# Patient Record
Sex: Male | Born: 1935 | Race: White | Hispanic: No | Smoking: Former smoker
Health system: Southern US, Community
[De-identification: ages and names within clinical notes are randomized; demographics above are authoritative.]

## PROBLEM LIST (undated history)

## (undated) DIAGNOSIS — E079 Disorder of thyroid, unspecified: Secondary | ICD-10-CM

## (undated) DIAGNOSIS — E119 Type 2 diabetes mellitus without complications: Secondary | ICD-10-CM

## (undated) DIAGNOSIS — C801 Malignant (primary) neoplasm, unspecified: Secondary | ICD-10-CM

## (undated) DIAGNOSIS — H9190 Unspecified hearing loss, unspecified ear: Secondary | ICD-10-CM

## (undated) DIAGNOSIS — F329 Major depressive disorder, single episode, unspecified: Secondary | ICD-10-CM

## (undated) DIAGNOSIS — F32A Depression, unspecified: Secondary | ICD-10-CM

## (undated) DIAGNOSIS — M810 Age-related osteoporosis without current pathological fracture: Secondary | ICD-10-CM

## (undated) DIAGNOSIS — I10 Essential (primary) hypertension: Secondary | ICD-10-CM

## (undated) DIAGNOSIS — N289 Disorder of kidney and ureter, unspecified: Secondary | ICD-10-CM

## (undated) DIAGNOSIS — E7800 Pure hypercholesterolemia, unspecified: Secondary | ICD-10-CM

## (undated) HISTORY — PX: PROSTATECTOMY: SHX69

## (undated) HISTORY — PX: BACK SURGERY: SHX140

## (undated) HISTORY — PX: NEPHRECTOMY: SHX65

## (undated) HISTORY — PX: JOINT REPLACEMENT: SHX530

---

## 2016-05-05 ENCOUNTER — Emergency Department (HOSPITAL_BASED_OUTPATIENT_CLINIC_OR_DEPARTMENT_OTHER): Payer: Medicare HMO

## 2016-05-05 ENCOUNTER — Observation Stay (HOSPITAL_BASED_OUTPATIENT_CLINIC_OR_DEPARTMENT_OTHER): Payer: Medicare HMO

## 2016-05-05 ENCOUNTER — Observation Stay (HOSPITAL_COMMUNITY): Payer: Medicare HMO

## 2016-05-05 ENCOUNTER — Encounter (HOSPITAL_BASED_OUTPATIENT_CLINIC_OR_DEPARTMENT_OTHER): Payer: Self-pay

## 2016-05-05 ENCOUNTER — Inpatient Hospital Stay (HOSPITAL_BASED_OUTPATIENT_CLINIC_OR_DEPARTMENT_OTHER)
Admission: EM | Admit: 2016-05-05 | Discharge: 2016-05-09 | DRG: 689 | Disposition: A | Discharge status: Skilled Nursing Facility | Payer: Medicare HMO | Attending: Internal Medicine | Admitting: Internal Medicine

## 2016-05-05 DIAGNOSIS — Z7982 Long term (current) use of aspirin: Secondary | ICD-10-CM

## 2016-05-05 DIAGNOSIS — G936 Cerebral edema: Secondary | ICD-10-CM | POA: Diagnosis present

## 2016-05-05 DIAGNOSIS — N183 Chronic kidney disease, stage 3 (moderate): Secondary | ICD-10-CM | POA: Diagnosis present

## 2016-05-05 DIAGNOSIS — H9190 Unspecified hearing loss, unspecified ear: Secondary | ICD-10-CM | POA: Diagnosis present

## 2016-05-05 DIAGNOSIS — M810 Age-related osteoporosis without current pathological fracture: Secondary | ICD-10-CM | POA: Diagnosis present

## 2016-05-05 DIAGNOSIS — I071 Rheumatic tricuspid insufficiency: Secondary | ICD-10-CM | POA: Diagnosis present

## 2016-05-05 DIAGNOSIS — IMO0002 Reserved for concepts with insufficient information to code with codable children: Secondary | ICD-10-CM | POA: Diagnosis present

## 2016-05-05 DIAGNOSIS — N390 Urinary tract infection, site not specified: Secondary | ICD-10-CM | POA: Diagnosis present

## 2016-05-05 DIAGNOSIS — E119 Type 2 diabetes mellitus without complications: Secondary | ICD-10-CM

## 2016-05-05 DIAGNOSIS — R7989 Other specified abnormal findings of blood chemistry: Secondary | ICD-10-CM | POA: Diagnosis present

## 2016-05-05 DIAGNOSIS — E1122 Type 2 diabetes mellitus with diabetic chronic kidney disease: Secondary | ICD-10-CM | POA: Diagnosis present

## 2016-05-05 DIAGNOSIS — Z87442 Personal history of urinary calculi: Secondary | ICD-10-CM

## 2016-05-05 DIAGNOSIS — R627 Adult failure to thrive: Secondary | ICD-10-CM | POA: Diagnosis present

## 2016-05-05 DIAGNOSIS — Z9119 Patient's noncompliance with other medical treatment and regimen: Secondary | ICD-10-CM

## 2016-05-05 DIAGNOSIS — E7800 Pure hypercholesterolemia, unspecified: Secondary | ICD-10-CM | POA: Diagnosis present

## 2016-05-05 DIAGNOSIS — R410 Disorientation, unspecified: Secondary | ICD-10-CM

## 2016-05-05 DIAGNOSIS — I129 Hypertensive chronic kidney disease with stage 1 through stage 4 chronic kidney disease, or unspecified chronic kidney disease: Secondary | ICD-10-CM | POA: Diagnosis present

## 2016-05-05 DIAGNOSIS — I4891 Unspecified atrial fibrillation: Secondary | ICD-10-CM | POA: Diagnosis present

## 2016-05-05 DIAGNOSIS — F329 Major depressive disorder, single episode, unspecified: Secondary | ICD-10-CM | POA: Diagnosis present

## 2016-05-05 DIAGNOSIS — C801 Malignant (primary) neoplasm, unspecified: Secondary | ICD-10-CM | POA: Diagnosis present

## 2016-05-05 DIAGNOSIS — R509 Fever, unspecified: Secondary | ICD-10-CM | POA: Diagnosis present

## 2016-05-05 DIAGNOSIS — Z7984 Long term (current) use of oral hypoglycemic drugs: Secondary | ICD-10-CM

## 2016-05-05 DIAGNOSIS — E079 Disorder of thyroid, unspecified: Secondary | ICD-10-CM | POA: Diagnosis present

## 2016-05-05 DIAGNOSIS — Z87891 Personal history of nicotine dependence: Secondary | ICD-10-CM

## 2016-05-05 DIAGNOSIS — G9340 Encephalopathy, unspecified: Secondary | ICD-10-CM | POA: Diagnosis present

## 2016-05-05 DIAGNOSIS — D329 Benign neoplasm of meninges, unspecified: Secondary | ICD-10-CM | POA: Diagnosis present

## 2016-05-05 DIAGNOSIS — R531 Weakness: Secondary | ICD-10-CM | POA: Diagnosis not present

## 2016-05-05 HISTORY — DX: Unspecified hearing loss, unspecified ear: H91.90

## 2016-05-05 HISTORY — DX: Depression, unspecified: F32.A

## 2016-05-05 HISTORY — DX: Pure hypercholesterolemia, unspecified: E78.00

## 2016-05-05 HISTORY — DX: Disorder of thyroid, unspecified: E07.9

## 2016-05-05 HISTORY — DX: Disorder of kidney and ureter, unspecified: N28.9

## 2016-05-05 HISTORY — DX: Major depressive disorder, single episode, unspecified: F32.9

## 2016-05-05 HISTORY — DX: Malignant (primary) neoplasm, unspecified: C80.1

## 2016-05-05 HISTORY — DX: Age-related osteoporosis without current pathological fracture: M81.0

## 2016-05-05 HISTORY — DX: Type 2 diabetes mellitus without complications: E11.9

## 2016-05-05 HISTORY — DX: Essential (primary) hypertension: I10

## 2016-05-05 LAB — TROPONIN I: Troponin I: 0.03 ng/mL (ref <0.03)

## 2016-05-05 LAB — CBC WITH DIFFERENTIAL/PLATELET
BASOS PCT: 0 %
Basophils Absolute: 0 10*3/uL (ref 0.0–0.1)
EOS ABS: 0.1 10*3/uL (ref 0.0–0.7)
Eosinophils Relative: 0 %
HCT: 45.1 % (ref 39.0–52.0)
HEMOGLOBIN: 14.8 g/dL (ref 13.0–17.0)
Lymphocytes Relative: 5 %
Lymphs Abs: 0.6 10*3/uL — ABNORMAL LOW (ref 0.7–4.0)
MCH: 29.7 pg (ref 26.0–34.0)
MCHC: 32.8 g/dL (ref 30.0–36.0)
MCV: 90.4 fL (ref 78.0–100.0)
MONOS PCT: 7 %
Monocytes Absolute: 0.8 10*3/uL (ref 0.1–1.0)
NEUTROS PCT: 88 %
Neutro Abs: 10.1 10*3/uL — ABNORMAL HIGH (ref 1.7–7.7)
Platelets: 204 10*3/uL (ref 150–400)
RBC: 4.99 MIL/uL (ref 4.22–5.81)
RDW: 14.5 % (ref 11.5–15.5)
WBC: 11.5 10*3/uL — AB (ref 4.0–10.5)

## 2016-05-05 LAB — URINALYSIS, ROUTINE W REFLEX MICROSCOPIC
BILIRUBIN URINE: NEGATIVE
Glucose, UA: NEGATIVE mg/dL
KETONES UR: NEGATIVE mg/dL
NITRITE: NEGATIVE
PROTEIN: 100 mg/dL — AB
Specific Gravity, Urine: 1.018 (ref 1.005–1.030)
pH: 5.5 (ref 5.0–8.0)

## 2016-05-05 LAB — COMPREHENSIVE METABOLIC PANEL
ALBUMIN: 4.1 g/dL (ref 3.5–5.0)
ALT: 20 U/L (ref 17–63)
ANION GAP: 11 (ref 5–15)
AST: 29 U/L (ref 15–41)
Alkaline Phosphatase: 59 U/L (ref 38–126)
BUN: 26 mg/dL — ABNORMAL HIGH (ref 6–20)
CALCIUM: 9.1 mg/dL (ref 8.9–10.3)
CO2: 23 mmol/L (ref 22–32)
Chloride: 101 mmol/L (ref 101–111)
Creatinine, Ser: 1.78 mg/dL — ABNORMAL HIGH (ref 0.61–1.24)
GFR calc Af Amer: 39 mL/min — ABNORMAL LOW (ref >60)
GFR calc non Af Amer: 34 mL/min — ABNORMAL LOW (ref >60)
GLUCOSE: 164 mg/dL — AB (ref 65–99)
Potassium: 4.4 mmol/L (ref 3.5–5.1)
SODIUM: 135 mmol/L (ref 135–145)
Total Bilirubin: 0.9 mg/dL (ref 0.3–1.2)
Total Protein: 7.7 g/dL (ref 6.5–8.1)

## 2016-05-05 LAB — URINALYSIS, MICROSCOPIC (REFLEX)

## 2016-05-05 LAB — CK: Total CK: 299 U/L (ref 49–397)

## 2016-05-05 LAB — ETHANOL: Alcohol, Ethyl (B): <5 mg/dL (ref <5)

## 2016-05-05 LAB — LIPASE, BLOOD: LIPASE: 13 U/L (ref 11–51)

## 2016-05-05 LAB — I-STAT CG4 LACTIC ACID, ED: LACTIC ACID, VENOUS: 1.5 mmol/L (ref 0.5–1.9)

## 2016-05-05 LAB — PROTIME-INR
INR: 1.05
Prothrombin Time: 13.7 seconds (ref 11.4–15.2)

## 2016-05-05 LAB — AMMONIA: Ammonia: 38 umol/L — ABNORMAL HIGH (ref 9–35)

## 2016-05-05 MED ORDER — SODIUM CHLORIDE 0.9 % IV SOLN: 250.0000 mL | INTRAVENOUS | Status: DC | PRN

## 2016-05-05 MED ORDER — SODIUM CHLORIDE 0.9% FLUSH: 3.0000 mL | INTRAVENOUS | Status: DC | PRN

## 2016-05-05 MED ORDER — ASPIRIN 81 MG PO CHEW
324.0000 mg | CHEWABLE_TABLET | Freq: Once | ORAL | Status: AC
  Administered 2016-05-05: 324 mg via ORAL
  Filled 2016-05-05: qty 4

## 2016-05-05 MED ORDER — SODIUM CHLORIDE 0.9% FLUSH
3.0000 mL | Freq: Two times a day (BID) | INTRAVENOUS | Status: DC
  Administered 2016-05-06 – 2016-05-09 (×6): 3 mL via INTRAVENOUS

## 2016-05-05 MED ORDER — METOPROLOL TARTRATE 25 MG PO TABS
25.0000 mg | ORAL_TABLET | Freq: Once | ORAL | Status: AC
  Administered 2016-05-06: 25 mg via ORAL
  Filled 2016-05-05: qty 1

## 2016-05-05 MED ORDER — DEXTROSE 5 % IV SOLN
1.0000 g | INTRAVENOUS | Status: DC
  Administered 2016-05-06 – 2016-05-08 (×4): 1 g via INTRAVENOUS
  Filled 2016-05-05 (×4): qty 10

## 2016-05-05 NOTE — ED Provider Notes (Signed)
[facility] DEPT [facility] Provider Note   CSN: [id number] Arrival date & time: 05/05/16  1833    By signing my name below, I, Macon Large, attest that this documentation has been prepared under the direction and in the presence of Millicent Blazejewski Julio Alm, MD. Electronically Signed: Macon Large, ED Scribe. 05/05/16. 7:42 PM.  History   Chief Complaint Chief Complaint  Patient presents with  . Weakness   The history is provided by the patient and a relative. No language interpreter was used.   HPI Comments: Tanner Manning is a 83 y.o. male with PMHx of renal disorder, thyroid disease, HTN, DM, osteoporosis, high cholesterol and depression brought in by son who presents to the Emergency Department presenting for further evaluation of mental status decline onset two days ago. Per pt's son, he states pt was unaware that his birthday was today. He also notes pt has been having trouble recalling certain things. Son reports associated involuntary incontinence followed by a fowl urine odor and episodic diarrhea onset a couple of days ago. He states pt has had "unhealthy lifestyle" while he has been living at home with "no ventilation, no heat, no running water, no bathroom, and hasn't bathed in over a year". He also reports associated generalized weakness. He notes pt is unable to ambulate on his own. Son states "he takes about two hours just to move one foot in front of the other." No modifying factors noted. Pt denies abdominal pain.   Past Medical History:  Diagnosis Date  . Cancer [facility])    prostate and renal  . Depression   . Diabetes mellitus without complication ([place])   . Hard of hearing   . High cholesterol   . Hypertension   . Osteoporosis   . Renal disorder    kidney stone, stage 3 kidney disease  . Thyroid disease     There are no active problems to display for this patient.   Past Surgical History:  Procedure Laterality Date  . BACK SURGERY    . JOINT REPLACEMENT      . NEPHRECTOMY    . PROSTATECTOMY         Home Medications    Prior to Admission medications   Medication Sig Start Date End Date Taking? Authorizing Provider  aspirin EC 81 MG tablet Take 81 mg by mouth daily.   Yes Historical Provider, MD  citalopram (CELEXA) 40 MG tablet Take 40 mg by mouth daily.   Yes Historical Provider, MD  glimepiride (AMARYL) 4 MG tablet Take 4 mg by mouth daily with breakfast.   Yes Historical Provider, MD  labetalol (NORMODYNE) 200 MG tablet Take 200 mg by mouth 2 (two) times daily.   Yes Historical Provider, MD  levothyroxine (SYNTHROID, LEVOTHROID) 150 MCG tablet Take 150 mcg by mouth daily before breakfast.   Yes Historical Provider, MD  simvastatin (ZOCOR) 40 MG tablet Take 40 mg by mouth daily at 6 PM.   Yes Historical Provider, MD  Triptorelin Pamoate (TRELSTAR) 22.5 MG injection Inject 22.5 mg into the muscle every 6 (six) months.   Yes Historical Provider, MD    Family History No family history on file.  Social History Social History  Substance Use Topics  . Smoking status: Former Research scientist (life sciences)  . Smokeless tobacco: Never Used  . Alcohol use No     Allergies   Patient has no known allergies.   Review of Systems Review of Systems  Gastrointestinal: Positive for diarrhea. Negative for abdominal pain.  Genitourinary:       +  involuntary incontinence   Neurological: Positive for weakness.  Psychiatric/Behavioral: Positive for confusion.       +memory loss  All other systems reviewed and are negative.    Physical Exam Updated Vital Signs BP (!) 160/111 (BP Location: Right Arm)   Pulse 100   Temp (!) 100.4 F (38 C) (Oral)   Resp (!) 22   Ht 6' (1.829 m)   Wt 220 lb (99.8 kg)   SpO2 92%   BMI 29.84 kg/m   Physical Exam  Constitutional: He appears well-developed and well-nourished.  Toenail fungus, unkept toe nails.  Erythema in skin folds consistent with fungal infection   HENT:  Head: Normocephalic and atraumatic.  Scattered  carried teeth   Eyes: Conjunctivae are normal. Right eye exhibits no discharge. Left eye exhibits no discharge.  Sty in left eye.   Pulmonary/Chest: Effort normal.  Scattered rhonchi on right left lung.   Neurological: He is alert. Coordination normal.  Skin: Skin is warm and dry. No rash noted. He is not diaphoretic. No erythema.  Psychiatric: He has a normal mood and affect.  Nursing note and vitals reviewed.    ED Treatments / Results   DIAGNOSTIC STUDIES: Oxygen Saturation is 94% on RA, normal by my interpretation.    COORDINATION OF CARE: 7:20 PM Discussed treatment plan with pt's son and pt at bedside which includes labs, chest imaging, EKG and pt's son agreed to plan.   Labs (all labs ordered are listed, but only abnormal results are displayed) Labs Reviewed  URINALYSIS, ROUTINE W REFLEX MICROSCOPIC - Abnormal; Notable for the following:       Result Value   APPearance CLOUDY (*)    Hgb urine dipstick MODERATE (*)    Protein, ur 100 (*)    Leukocytes, UA TRACE (*)    All other components within normal limits  CBC WITH DIFFERENTIAL/PLATELET - Abnormal; Notable for the following:    WBC 11.5 (*)    Neutro Abs 10.1 (*)    Lymphs Abs 0.6 (*)    All other components within normal limits  COMPREHENSIVE METABOLIC PANEL - Abnormal; Notable for the following:    Glucose, Bld 164 (*)    BUN 26 (*)    Creatinine, Ser 1.78 (*)    GFR calc non Af Amer 34 (*)    GFR calc Af Amer 39 (*)    All other components within normal limits  TROPONIN I - Abnormal; Notable for the following:    Troponin I 0.03 (*)    All other components within normal limits  URINALYSIS, MICROSCOPIC (REFLEX) - Abnormal; Notable for the following:    Bacteria, UA RARE (*)    Squamous Epithelial / LPF 0-5 (*)    All other components within normal limits  AMMONIA - Abnormal; Notable for the following:    Ammonia 38 (*)    All other components within normal limits  CULTURE, BLOOD (ROUTINE X 2)    CULTURE, BLOOD (ROUTINE X 2)  LIPASE, BLOOD  PROTIME-INR  ETHANOL  CK  I-STAT CG4 LACTIC ACID, ED    EKG  EKG Interpretation None       Radiology Dg Chest 2 View  Result Date: 05/05/2016 CLINICAL DATA:  Failure to thrive. Incontinence. Memory loss. Chest pain. EXAM: CHEST  2 VIEW COMPARISON:  None. FINDINGS: Both lateral views are degraded by patient positioning, including the arms not being raised above the head. Moderate thoracic spondylosis. Mild to moderate right hemidiaphragm elevation. Apical lordotic frontal radiograph.  Midline trachea. Patient rotated minimally right on the frontal. Cardiomegaly. Mildly tortuous thoracic aorta. No pleural effusion or pneumothorax. No congestive failure. IMPRESSION: No acute cardiopulmonary disease. Cardiomegaly without congestive failure. Electronically Signed   By: Abigail Miyamoto M.D.   On: 05/05/2016 20:15    Procedures Procedures (including critical care time)  Medications Ordered in ED Medications - No data to display   Initial Impression / Assessment and Plan / ED Course  I have reviewed the triage vital signs and the nursing notes.  Pertinent labs & imaging results that were available during my care of the patient were reviewed by me and considered in my medical decision making (see chart for details).     I personally performed the services described in this documentation, which was scribed in my presence. The recorded information has been reviewed and is accurate.    Patient is an 83 year old male whose been living as a recluse. According to son patient's not had any heat, running water, and has been refusing care. Patient arrives here with fever, hypertension, tachycardia, and appearance of being unkempt. Patient appears to have mold growing on parts of his body and does not appear to have been bathed recently. Patient has elevated troponin, fever, blood cultures are pending. No current source of fever. Will admit for serial  troponins, blood cultures results.  Patient is unable to give me good history. It is unclear whether this A. fib is new. Patient's son is at bedside and has been unable to get patient to go to his physicians lately.  May require placement in rehab.   Final Clinical Impressions(s) / ED Diagnoses   Final diagnoses:  None    New Prescriptions New Prescriptions   No medications on file     Karman Biswell Julio Alm, MD 05/05/16 2051

## 2016-05-05 NOTE — ED Notes (Signed)
Pt will be going to [facility] room 2W10. Report to be called to [phone]

## 2016-05-05 NOTE — ED Notes (Signed)
Pts family member son took all his clothes to his car in a pt belongings bag.

## 2016-05-05 NOTE — ED Notes (Signed)
Attempted to give report to 2000. Nurse will call me back.

## 2016-05-05 NOTE — ED Notes (Signed)
Pt on monitor 

## 2016-05-05 NOTE — H&P (Signed)
History and Physical    Tanner Manning FOY:[id number] DOB: 1933-04-08 DOA: 05/05/2016  PCP: Manfred Shirts, PA  Patient coming from: home  Chief Complaint:   confusion  HPI: Tanner Manning is a 83 y.o. male with medical history significant of noncompliance has not seen a doctor for years was found at home by his family confused.  Unknown what pts baseline is.  Was found to be running a fever.  He says he has been weak for several days.  He is hard of hearing and does seem to be a little confused but for the most part he knows he is in the hospital and it is 2018.  He denies any n/v/d.  No dysuria.  No cough.  No rashes.  Pt sent over from [facility] for fever.     Review of Systems: As per HPI otherwise 10 point review of systems negative.   Past Medical History:  Diagnosis Date  . Cancer [facility])    prostate and renal  . Depression   . Diabetes mellitus without complication ([place])   . Hard of hearing   . High cholesterol   . Hypertension   . Osteoporosis   . Renal disorder    kidney stone, stage 3 kidney disease  . Thyroid disease     Past Surgical History:  Procedure Laterality Date  . BACK SURGERY    . JOINT REPLACEMENT    . NEPHRECTOMY    . PROSTATECTOMY       reports that he has quit smoking. He has never used smokeless tobacco. He reports that he does not drink alcohol. His drug history is not on file.  No Known Allergies  No family history on file. no h/o CAD  Prior to Admission medications   Medication Sig Start Date End Date Taking? Authorizing Provider  aspirin EC 81 MG tablet Take 81 mg by mouth daily.   Yes Historical Provider, MD  citalopram (CELEXA) 40 MG tablet Take 40 mg by mouth daily.   Yes Historical Provider, MD  glimepiride (AMARYL) 4 MG tablet Take 4 mg by mouth daily with breakfast.   Yes Historical Provider, MD  labetalol (NORMODYNE) 200 MG tablet Take 200 mg by mouth 2 (two) times daily.   Yes Historical Provider, MD  levothyroxine (SYNTHROID, LEVOTHROID)  150 MCG tablet Take 150 mcg by mouth daily before breakfast.   Yes Historical Provider, MD  simvastatin (ZOCOR) 40 MG tablet Take 40 mg by mouth daily at 6 PM.   Yes Historical Provider, MD  Triptorelin Pamoate (TRELSTAR) 22.5 MG injection Inject 22.5 mg into the muscle every 6 (six) months.   Yes Historical Provider, MD    Physical Exam: Vitals:   05/05/16 2119 05/05/16 2119 05/05/16 2146 05/05/16 2254  BP:   (!) 193/115 (!) 170/115  Pulse: (!) 105  96 (!) 109  Resp: (!) 24  (!) 26 (!) 24  Temp:  100.1 F (37.8 C)  (!) 100.5 F (38.1 C)  TempSrc:  Oral  Oral  SpO2: 94%  92% 95%  Weight:    120.4 kg (265 lb 6.9 oz)  Height:        Constitutional: NAD, calm, comfortable Vitals:   05/05/16 2119 05/05/16 2119 05/05/16 2146 05/05/16 2254  BP:   (!) 193/115 (!) 170/115  Pulse: (!) 105  96 (!) 109  Resp: (!) 24  (!) 26 (!) 24  Temp:  100.1 F (37.8 C)  (!) 100.5 F (38.1 C)  TempSrc:  Oral  Oral  SpO2: 94%  92% 95%  Weight:    120.4 kg (265 lb 6.9 oz)  Height:       Eyes: PERRL, lids and conjunctivae normal ENMT: Mucous membranes are moist. Posterior pharynx clear of any exudate or lesions.Normal dentition.  Neck: normal, supple, no masses, no thyromegaly Respiratory: clear to auscultation bilaterally, no wheezing, no crackles. Normal respiratory effort. No accessory muscle use.  Cardiovascular: Regular rate and rhythm, no murmurs / rubs / gallops. No extremity edema. 2+ pedal pulses. No carotid bruits.  Abdomen: no tenderness, no masses palpated. No hepatosplenomegaly. Bowel sounds positive.  Musculoskeletal: no clubbing / cyanosis. No joint deformity upper and lower extremities. Good ROM, no contractures. Normal muscle tone.  Skin: no rashes, lesions, ulcers. No induration Neurologic: CN 2-12 grossly intact. Sensation intact, DTR normal. Strength 5/5 in all 4.  Psychiatric: Normal judgment and insight. Alert and oriented x 3. Normal mood.    Labs on Admission: I have  personally reviewed following labs and imaging studies  CBC:  Recent Labs Lab 05/05/16 1850  WBC 11.5*  NEUTROABS 10.1*  HGB 14.8  HCT 45.1  MCV 90.4  PLT 086   Basic Metabolic Panel:  Recent Labs Lab 05/05/16 1850  NA 135  K 4.4  CL 101  CO2 23  GLUCOSE 164*  BUN 26*  CREATININE 1.78*  CALCIUM 9.1   GFR: Estimated Creatinine Clearance: 43.6 mL/min (A) (by C-G formula based on SCr of 1.78 mg/dL (H)). Liver Function Tests:  Recent Labs Lab 05/05/16 1850  AST 29  ALT 20  ALKPHOS 59  BILITOT 0.9  PROT 7.7  ALBUMIN 4.1    Recent Labs Lab 05/05/16 1850  LIPASE 13    Recent Labs Lab 05/05/16 1957  AMMONIA 38*   Coagulation Profile:  Recent Labs Lab 05/05/16 1850  INR 1.05   Cardiac Enzymes:  Recent Labs Lab 05/05/16 1850  CKTOTAL 299  TROPONINI 0.03*   Urine analysis:    Component Value Date/Time   COLORURINE YELLOW 05/05/2016 1849   APPEARANCEUR CLOUDY (A) 05/05/2016 1849   LABSPEC 1.018 05/05/2016 1849   PHURINE 5.5 05/05/2016 1849   GLUCOSEU NEGATIVE 05/05/2016 1849   HGBUR MODERATE (A) 05/05/2016 1849   BILIRUBINUR NEGATIVE 05/05/2016 [place] 05/05/2016 1849   PROTEINUR 100 (A) 05/05/2016 1849   NITRITE NEGATIVE 05/05/2016 1849   LEUKOCYTESUR TRACE (A) 05/05/2016 1849    Recent Results (from the past 240 hour(s))  Culture, blood (Routine X 2) w Reflex to ID Panel     Status: None (Preliminary result)   Collection Time: 05/05/16  7:45 PM  Result Value Ref Range Status   Specimen Description   Final    BLOOD LEFT AC Performed at [facility], [place] [address] [place], [place] [address]    Special Requests BOTTLES DRAWN AEROBIC AND ANAEROBIC [facility]  Final   Culture PENDING  Incomplete   Report Status PENDING  Incomplete     Radiological Exams on Admission: Dg Chest 2 View  Result Date: 05/05/2016 CLINICAL DATA:  Failure to thrive. Incontinence. Memory loss. Chest pain. EXAM: CHEST  2 VIEW  COMPARISON:  None. FINDINGS: Both lateral views are degraded by patient positioning, including the arms not being raised above the head. Moderate thoracic spondylosis. Mild to moderate right hemidiaphragm elevation. Apical lordotic frontal radiograph. Midline trachea. Patient rotated minimally right on the frontal. Cardiomegaly. Mildly tortuous thoracic aorta. No pleural effusion or pneumothorax. No congestive failure. IMPRESSION: No acute cardiopulmonary disease.  Cardiomegaly without congestive failure. Electronically Signed   By: Abigail Miyamoto M.D.   On: 05/05/2016 20:15    Assessment/Plan 83 yo male with delirium from fever may be due to UTI  Principal Problem:   Acute encephalopathy- delirious.  From fever.  Unknown if has baseline of dementia.  Treat possible uti.  Active Problems:   Diabetes mellitus without complication ([place])- stable   Cancer ([place])- stable   Fever- as above   Acute lower UTI- iv rocephin.  cx pending   A-fib ([place])- unknown if new, start low dose bblocker     DVT prophylaxis:  scds  Code Status:  full Family Communication:  none  Disposition Plan:   Per day team Consults called:  none Admission status:  observation   Keili Hasten A MD [facility]  If 7PM-7AM, please contact night-coverage [email or web address] Password [facility]  05/05/2016, 11:26 PM

## 2016-05-05 NOTE — ED Triage Notes (Addendum)
Pt's son brings him in for failure to thrive, weakness, diarrhea, foul urine odor, incontinence, memory loss, is unsure as to how long this is going on, at least the last two weeks

## 2016-05-06 ENCOUNTER — Observation Stay (HOSPITAL_COMMUNITY): Payer: Medicare HMO

## 2016-05-06 DIAGNOSIS — R7989 Other specified abnormal findings of blood chemistry: Secondary | ICD-10-CM | POA: Diagnosis present

## 2016-05-06 DIAGNOSIS — I4891 Unspecified atrial fibrillation: Secondary | ICD-10-CM | POA: Diagnosis not present

## 2016-05-06 DIAGNOSIS — N390 Urinary tract infection, site not specified: Principal | ICD-10-CM

## 2016-05-06 DIAGNOSIS — R509 Fever, unspecified: Secondary | ICD-10-CM

## 2016-05-06 DIAGNOSIS — IMO0002 Reserved for concepts with insufficient information to code with codable children: Secondary | ICD-10-CM | POA: Diagnosis present

## 2016-05-06 DIAGNOSIS — C801 Malignant (primary) neoplasm, unspecified: Secondary | ICD-10-CM

## 2016-05-06 DIAGNOSIS — G9340 Encephalopathy, unspecified: Secondary | ICD-10-CM | POA: Diagnosis not present

## 2016-05-06 DIAGNOSIS — E119 Type 2 diabetes mellitus without complications: Secondary | ICD-10-CM | POA: Diagnosis not present

## 2016-05-06 LAB — BASIC METABOLIC PANEL
Anion gap: 9 (ref 5–15)
BUN: 23 mg/dL — ABNORMAL HIGH (ref 6–20)
CALCIUM: 9.1 mg/dL (ref 8.9–10.3)
CO2: 26 mmol/L (ref 22–32)
CREATININE: 1.73 mg/dL — AB (ref 0.61–1.24)
Chloride: 100 mmol/L — ABNORMAL LOW (ref 101–111)
GFR calc Af Amer: 41 mL/min — ABNORMAL LOW (ref >60)
GFR calc non Af Amer: 35 mL/min — ABNORMAL LOW (ref >60)
Glucose, Bld: 123 mg/dL — ABNORMAL HIGH (ref 65–99)
Potassium: 4.3 mmol/L (ref 3.5–5.1)
SODIUM: 135 mmol/L (ref 135–145)

## 2016-05-06 LAB — GLUCOSE, CAPILLARY
GLUCOSE-CAPILLARY: 119 mg/dL — AB (ref 65–99)
GLUCOSE-CAPILLARY: 131 mg/dL — AB (ref 65–99)
GLUCOSE-CAPILLARY: 112 mg/dL — AB (ref 65–99)

## 2016-05-06 LAB — RAPID URINE DRUG SCREEN, HOSP PERFORMED
AMPHETAMINES: NOT DETECTED
Barbiturates: NOT DETECTED
Benzodiazepines: NOT DETECTED
Cocaine: NOT DETECTED
Opiates: NOT DETECTED
TETRAHYDROCANNABINOL: NOT DETECTED

## 2016-05-06 LAB — HEMOGLOBIN A1C
Hgb A1c MFr Bld: 5.6 % (ref 4.8–5.6)
Mean Plasma Glucose: 114 mg/dL

## 2016-05-06 LAB — TSH: TSH: 2.889 u[IU]/mL (ref 0.350–4.500)

## 2016-05-06 LAB — CBC
HCT: 43.5 % (ref 39.0–52.0)
Hemoglobin: 13.8 g/dL (ref 13.0–17.0)
MCH: 28.9 pg (ref 26.0–34.0)
MCHC: 31.7 g/dL (ref 30.0–36.0)
MCV: 91 fL (ref 78.0–100.0)
PLATELETS: 173 10*3/uL (ref 150–400)
RBC: 4.78 MIL/uL (ref 4.22–5.81)
RDW: 14.1 % (ref 11.5–15.5)
WBC: 8.9 10*3/uL (ref 4.0–10.5)

## 2016-05-06 LAB — INFLUENZA PANEL BY PCR (TYPE A & B)
INFLAPCR: NEGATIVE
INFLBPCR: NEGATIVE

## 2016-05-06 MED ORDER — ASPIRIN 325 MG PO TABS
325.0000 mg | ORAL_TABLET | Freq: Every day | ORAL | Status: DC
  Administered 2016-05-06 – 2016-05-07 (×2): 325 mg via ORAL
  Filled 2016-05-06 (×2): qty 1

## 2016-05-06 MED ORDER — SODIUM CHLORIDE 0.9 % IV SOLN
INTRAVENOUS | Status: DC
  Administered 2016-05-06 – 2016-05-07 (×2): via INTRAVENOUS

## 2016-05-06 MED ORDER — INSULIN ASPART 100 UNIT/ML ~~LOC~~ SOLN
0.0000 [IU] | Freq: Three times a day (TID) | SUBCUTANEOUS | Status: DC
  Administered 2016-05-06 – 2016-05-09 (×5): 1 [IU] via SUBCUTANEOUS

## 2016-05-06 MED ORDER — GADOBENATE DIMEGLUMINE 529 MG/ML IV SOLN
20.0000 mL | Freq: Once | INTRAVENOUS | Status: AC | PRN
  Administered 2016-05-06: 20 mL via INTRAVENOUS

## 2016-05-06 MED ORDER — LABETALOL HCL 200 MG PO TABS
200.0000 mg | ORAL_TABLET | Freq: Two times a day (BID) | ORAL | Status: DC
  Administered 2016-05-06 – 2016-05-09 (×7): 200 mg via ORAL
  Filled 2016-05-06 (×8): qty 1

## 2016-05-06 MED ORDER — HYDRALAZINE HCL 20 MG/ML IJ SOLN
10.0000 mg | INTRAMUSCULAR | Status: DC | PRN
  Administered 2016-05-06 – 2016-05-07 (×3): 10 mg via INTRAVENOUS
  Filled 2016-05-06 (×3): qty 1

## 2016-05-06 NOTE — Progress Notes (Signed)
PROGRESS NOTE  Tanner Manning  LOV:[id number] DOB: May 22, 1933 DOA: 05/05/2016 PCP: Manfred Shirts, PA Outpatient Specialists:  Subjective: Patient is hard of hearing, denies any complaints this morning. Blood pressure is elevated and his maximum temperature was 100.5.  Brief Narrative:  Tanner Manning is a 83 y.o. male with medical history significant of noncompliance has not seen a doctor for years was found at home by his family confused.  Unknown what pts baseline is.  Was found to be running a fever.  He says he has been weak for several days.  He is hard of hearing and does seem to be a little confused but for the most part he knows he is in the hospital and it is 2018.  He denies any n/v/d.  No dysuria.  No cough.  No rashes.  Pt sent over from [facility] for fever.    Assessment & Plan:   Principal Problem:   Acute encephalopathy Active Problems:   Diabetes mellitus without complication (HCC)   Cancer (HCC)   Fever   Acute lower UTI   A-fib (HCC)   Fever Unclear etiology, his CXR is negative, final panel is pending. Been treated for presumed UTI, his UA showed some bacteria. Continue current antibiotics and follow urine culture.  Accelerated hypertension Blood pressure is 203/127 this morning, giving hydralazine, restarted on his labetalol. CT scan of the head showed no acute findings. Control his blood pressure with labetalol and hydralazine as needed, he probably will need more medications start Norvasc.  Diabetes mellitus Carbohydrate modified diet and insulin sliding scale.  Atrial fibrillation Not documented before, presumed to be new, currently rate is controlled with beta blockers. CHA2DS2-VASc is at least 4, HTN, DM, and 2+ for his age, he will need anticoagulation   Confusion Preadmission he was hard of hearing but somewhat confused, he knows where he had cannot remember a lot. With findings of atrial fibrillation, accelerated hypertension and confusion wall check MRI  to rule out CVA.    DVT prophylaxis:  Code Status: Full Code Family Communication:  Disposition Plan:  Diet: Diet Heart Room service appropriate? Yes; Fluid consistency: Thin  Consultants:   None  Procedures:   None  Antimicrobials:   None   Objective: Vitals:   05/05/16 2146 05/05/16 2254 05/06/16 0605 05/06/16 0702  BP: (!) 193/115 (!) 170/115 (!) 203/127 (!) 187/114  Pulse: 96 (!) 109 91 96  Resp: (!) 26 (!) 24 20   Temp:  (!) 100.5 F (38.1 C) 99.5 F (37.5 C)   TempSrc:  Oral Oral   SpO2: 92% 95% 94%   Weight:  120.4 kg (265 lb 6.9 oz)    Height:        Intake/Output Summary (Last 24 hours) at 05/06/16 1008 Last data filed at 05/06/16 0900  Gross per 24 hour  Intake              480 ml  Output              850 ml  Net             -370 ml   Filed Weights   05/05/16 1841 05/05/16 2254  Weight: 99.8 kg (220 lb) 120.4 kg (265 lb 6.9 oz)    Examination: General exam: Appears calm and comfortable  Respiratory system: Clear to auscultation. Respiratory effort normal. Cardiovascular system: S1 & S2 heard, RRR. No JVD, murmurs, rubs, gallops or clicks. No pedal edema. Gastrointestinal system: Abdomen is nondistended, soft and nontender.  No organomegaly or masses felt. Normal bowel sounds heard. Central nervous system: Alert and oriented. No focal neurological deficits. Extremities: Symmetric 5 x 5 power. Skin: No rashes, lesions or ulcers Psychiatry: Judgement and insight appear normal. Mood & affect appropriate.   Data Reviewed: I have personally reviewed following labs and imaging studies  CBC:  Recent Labs Lab 05/05/16 1850 05/06/16 0022  WBC 11.5* 8.9  NEUTROABS 10.1*  --   HGB 14.8 13.8  HCT 45.1 43.5  MCV 90.4 91.0  PLT 204 295   Basic Metabolic Panel:  Recent Labs Lab 05/05/16 1850 05/06/16 0022  NA 135 135  K 4.4 4.3  CL 101 100*  CO2 23 26  GLUCOSE 164* 123*  BUN 26* 23*  CREATININE 1.78* 1.73*  CALCIUM 9.1 9.1    GFR: Estimated Creatinine Clearance: 44.9 mL/min (A) (by C-G formula based on SCr of 1.73 mg/dL (H)). Liver Function Tests:  Recent Labs Lab 05/05/16 1850  AST 29  ALT 20  ALKPHOS 59  BILITOT 0.9  PROT 7.7  ALBUMIN 4.1    Recent Labs Lab 05/05/16 1850  LIPASE 13    Recent Labs Lab 05/05/16 1957  AMMONIA 38*   Coagulation Profile:  Recent Labs Lab 05/05/16 1850  INR 1.05   Cardiac Enzymes:  Recent Labs Lab 05/05/16 1850  CKTOTAL 299  TROPONINI 0.03*   BNP (last 3 results) No results for input(s): PROBNP in the last 8760 hours. HbA1C: No results for input(s): HGBA1C in the last 72 hours. CBG:  Recent Labs Lab 05/06/16 0602  GLUCAP 119*   Lipid Profile: No results for input(s): CHOL, HDL, LDLCALC, TRIG, CHOLHDL, LDLDIRECT in the last 72 hours. Thyroid Function Tests:  Recent Labs  05/06/16 0022  TSH 2.889   Anemia Panel: No results for input(s): VITAMINB12, FOLATE, FERRITIN, TIBC, IRON, RETICCTPCT in the last 72 hours. Urine analysis:    Component Value Date/Time   COLORURINE YELLOW 05/05/2016 1849   APPEARANCEUR CLOUDY (A) 05/05/2016 1849   LABSPEC 1.018 05/05/2016 1849   PHURINE 5.5 05/05/2016 1849   GLUCOSEU NEGATIVE 05/05/2016 1849   HGBUR MODERATE (A) 05/05/2016 1849   BILIRUBINUR NEGATIVE 05/05/2016 [place] 05/05/2016 1849   PROTEINUR 100 (A) 05/05/2016 1849   NITRITE NEGATIVE 05/05/2016 1849   LEUKOCYTESUR TRACE (A) 05/05/2016 1849   Sepsis Labs: @LABRCNTIP (procalcitonin:4,lacticidven:4)  ) Recent Results (from the past 240 hour(s))  Culture, blood (Routine X 2) w Reflex to ID Panel     Status: None (Preliminary result)   Collection Time: 05/05/16  7:45 PM  Result Value Ref Range Status   Specimen Description   Final    BLOOD LEFT AC Performed at [facility], [place] [address] [place], [place] [address]    Special Requests BOTTLES DRAWN AEROBIC AND ANAEROBIC [facility] EACH  Final   Culture PENDING   Incomplete   Report Status PENDING  Incomplete     Invalid input(s): PROCALCITONIN, LACTICACIDVEN   Radiology Studies: Dg Chest 2 View  Result Date: 05/05/2016 CLINICAL DATA:  Failure to thrive. Incontinence. Memory loss. Chest pain. EXAM: CHEST  2 VIEW COMPARISON:  None. FINDINGS: Both lateral views are degraded by patient positioning, including the arms not being raised above the head. Moderate thoracic spondylosis. Mild to moderate right hemidiaphragm elevation. Apical lordotic frontal radiograph. Midline trachea. Patient rotated minimally right on the frontal. Cardiomegaly. Mildly tortuous thoracic aorta. No pleural effusion or pneumothorax. No congestive failure. IMPRESSION: No acute cardiopulmonary disease. Cardiomegaly without congestive failure.  Electronically Signed   By: Abigail Miyamoto M.D.   On: 05/05/2016 20:15   Ct Head Wo Contrast  Result Date: 05/05/2016 CLINICAL DATA:  Status post fall, with concern for head injury. Initial encounter. EXAM: CT HEAD WITHOUT CONTRAST TECHNIQUE: Contiguous axial images were obtained from the base of the skull through the vertex without intravenous contrast. COMPARISON:  None. FINDINGS: Brain: No evidence of acute infarction, hemorrhage, hydrocephalus, extra-axial collection or mass lesion/mass effect. Prominence of the ventricles and sulci reflects mild to moderate cortical volume loss. Diffuse periventricular and subcortical white matter change likely reflects small vessel ischemic microangiopathy. The brainstem and fourth ventricle are within normal limits. The basal ganglia are unremarkable in appearance. The cerebral hemispheres demonstrate grossly normal gray-white differentiation. No mass effect or midline shift is seen. Vascular: No hyperdense vessel or unexpected calcification. Skull: There is no evidence of fracture; visualized osseous structures are unremarkable in appearance. Sinuses/Orbits: The orbits are within normal limits. The paranasal  sinuses and mastoid air cells are well-aerated. Other: No significant soft tissue abnormalities are seen. IMPRESSION: 1. No evidence of traumatic intracranial injury or fracture. 2. Mild to moderate cortical volume loss and diffuse small vessel ischemic microangiopathy. Electronically Signed   By: Garald Balding M.D.   On: 05/05/2016 23:53     Scheduled Meds: . cefTRIAXone (ROCEPHIN)  IV  1 g Intravenous Q24H  . insulin aspart  0-9 Units Subcutaneous TID WC  . labetalol  200 mg Oral BID  . sodium chloride flush  3 mL Intravenous Q12H   Continuous Infusions:   LOS: 0 days    Time spent: 35 minutes    Angelica Frandsen A, MD [facility] Pager [phone]  If 7PM-7AM, please contact night-coverage [email or web address] Password [email or web address] 05/06/2016, 10:08 AM

## 2016-05-06 NOTE — Care Management Note (Signed)
Case Management Note Marvetta Gibbons RN, BSN Unit 2W-Case Manager [phone]  Patient Details  Name: Viliami Bracco MRN: [id number] Date of Birth: 06/24/1933  Subjective/Objective:   Pt presented with fever, acute encephalopathy                 Action/Plan: PTA pt lived at home alone, per conversation with pt he states that he has family nearby, pt also reports that he has a cane and walker at home. Pt has not seen a doctor in years.  PT/OT evals pending- CM to follow for d/c needs  Expected Discharge Date:                  Expected Discharge Plan:  Home/Self Care  In-House Referral:     Discharge planning Services  CM Consult  Post Acute Care Choice:  NA Choice offered to:  NA  DME Arranged:    DME Agency:     HH Arranged:    [place] Agency:     Status of Service:  In process, will continue to follow  If discussed at Long Length of Stay Meetings, dates discussed:    Additional Comments:  Dawayne Patricia, RN 05/06/2016, 4:03 PM

## 2016-05-06 NOTE — Care Management Obs Status (Signed)
[place] NOTIFICATION   Patient Details  Name: Iley Breeden MRN: [id number] Date of Birth: Oct 16, 1933   Medicare Observation Status Notification Given:  Yes    Marvetta Gibbons [place], RN 05/06/2016, 4:02 PM

## 2016-05-07 ENCOUNTER — Encounter (HOSPITAL_COMMUNITY): Payer: Self-pay | Admitting: General Practice

## 2016-05-07 DIAGNOSIS — E7800 Pure hypercholesterolemia, unspecified: Secondary | ICD-10-CM | POA: Diagnosis present

## 2016-05-07 DIAGNOSIS — R410 Disorientation, unspecified: Secondary | ICD-10-CM | POA: Diagnosis not present

## 2016-05-07 DIAGNOSIS — G936 Cerebral edema: Secondary | ICD-10-CM | POA: Diagnosis present

## 2016-05-07 DIAGNOSIS — I129 Hypertensive chronic kidney disease with stage 1 through stage 4 chronic kidney disease, or unspecified chronic kidney disease: Secondary | ICD-10-CM | POA: Diagnosis present

## 2016-05-07 DIAGNOSIS — E079 Disorder of thyroid, unspecified: Secondary | ICD-10-CM | POA: Diagnosis present

## 2016-05-07 DIAGNOSIS — M810 Age-related osteoporosis without current pathological fracture: Secondary | ICD-10-CM | POA: Diagnosis present

## 2016-05-07 DIAGNOSIS — G9340 Encephalopathy, unspecified: Secondary | ICD-10-CM | POA: Diagnosis present

## 2016-05-07 DIAGNOSIS — R7989 Other specified abnormal findings of blood chemistry: Secondary | ICD-10-CM

## 2016-05-07 DIAGNOSIS — Z9119 Patient's noncompliance with other medical treatment and regimen: Secondary | ICD-10-CM | POA: Diagnosis not present

## 2016-05-07 DIAGNOSIS — R531 Weakness: Secondary | ICD-10-CM | POA: Diagnosis present

## 2016-05-07 DIAGNOSIS — N390 Urinary tract infection, site not specified: Secondary | ICD-10-CM | POA: Diagnosis present

## 2016-05-07 DIAGNOSIS — Z7982 Long term (current) use of aspirin: Secondary | ICD-10-CM | POA: Diagnosis not present

## 2016-05-07 DIAGNOSIS — Z87891 Personal history of nicotine dependence: Secondary | ICD-10-CM | POA: Diagnosis not present

## 2016-05-07 DIAGNOSIS — F329 Major depressive disorder, single episode, unspecified: Secondary | ICD-10-CM | POA: Diagnosis present

## 2016-05-07 DIAGNOSIS — I509 Heart failure, unspecified: Secondary | ICD-10-CM | POA: Diagnosis not present

## 2016-05-07 DIAGNOSIS — Z87442 Personal history of urinary calculi: Secondary | ICD-10-CM | POA: Diagnosis not present

## 2016-05-07 DIAGNOSIS — Z7984 Long term (current) use of oral hypoglycemic drugs: Secondary | ICD-10-CM | POA: Diagnosis not present

## 2016-05-07 DIAGNOSIS — I4891 Unspecified atrial fibrillation: Secondary | ICD-10-CM | POA: Diagnosis present

## 2016-05-07 DIAGNOSIS — D329 Benign neoplasm of meninges, unspecified: Secondary | ICD-10-CM | POA: Diagnosis present

## 2016-05-07 DIAGNOSIS — I071 Rheumatic tricuspid insufficiency: Secondary | ICD-10-CM | POA: Diagnosis present

## 2016-05-07 DIAGNOSIS — N183 Chronic kidney disease, stage 3 (moderate): Secondary | ICD-10-CM | POA: Diagnosis present

## 2016-05-07 DIAGNOSIS — E1122 Type 2 diabetes mellitus with diabetic chronic kidney disease: Secondary | ICD-10-CM | POA: Diagnosis present

## 2016-05-07 DIAGNOSIS — R627 Adult failure to thrive: Secondary | ICD-10-CM | POA: Diagnosis present

## 2016-05-07 DIAGNOSIS — C801 Malignant (primary) neoplasm, unspecified: Secondary | ICD-10-CM | POA: Diagnosis not present

## 2016-05-07 DIAGNOSIS — H9190 Unspecified hearing loss, unspecified ear: Secondary | ICD-10-CM | POA: Diagnosis present

## 2016-05-07 LAB — CBC
HEMATOCRIT: 44.1 % (ref 39.0–52.0)
HEMOGLOBIN: 14.2 g/dL (ref 13.0–17.0)
MCH: 29.2 pg (ref 26.0–34.0)
MCHC: 32.2 g/dL (ref 30.0–36.0)
MCV: 90.6 fL (ref 78.0–100.0)
Platelets: 168 10*3/uL (ref 150–400)
RBC: 4.87 MIL/uL (ref 4.22–5.81)
RDW: 14.4 % (ref 11.5–15.5)
WBC: 8.2 10*3/uL (ref 4.0–10.5)

## 2016-05-07 LAB — BASIC METABOLIC PANEL
ANION GAP: 9 (ref 5–15)
BUN: 22 mg/dL — ABNORMAL HIGH (ref 6–20)
CO2: 27 mmol/L (ref 22–32)
Calcium: 9 mg/dL (ref 8.9–10.3)
Chloride: 100 mmol/L — ABNORMAL LOW (ref 101–111)
Creatinine, Ser: 1.66 mg/dL — ABNORMAL HIGH (ref 0.61–1.24)
GFR calc Af Amer: 43 mL/min — ABNORMAL LOW (ref >60)
GFR calc non Af Amer: 37 mL/min — ABNORMAL LOW (ref >60)
GLUCOSE: 111 mg/dL — AB (ref 65–99)
POTASSIUM: 4.5 mmol/L (ref 3.5–5.1)
Sodium: 136 mmol/L (ref 135–145)

## 2016-05-07 LAB — BLOOD CULTURE ID PANEL (REFLEXED)
ACINETOBACTER BAUMANNII: NOT DETECTED
CANDIDA ALBICANS: NOT DETECTED
CANDIDA GLABRATA: NOT DETECTED
CANDIDA KRUSEI: NOT DETECTED
CANDIDA PARAPSILOSIS: NOT DETECTED
Candida tropicalis: NOT DETECTED
ENTEROBACTERIACEAE SPECIES: NOT DETECTED
ESCHERICHIA COLI: NOT DETECTED
Enterobacter cloacae complex: NOT DETECTED
Enterococcus species: NOT DETECTED
Haemophilus influenzae: NOT DETECTED
KLEBSIELLA OXYTOCA: NOT DETECTED
Klebsiella pneumoniae: NOT DETECTED
Listeria monocytogenes: NOT DETECTED
Methicillin resistance: NOT DETECTED
Neisseria meningitidis: NOT DETECTED
PSEUDOMONAS AERUGINOSA: NOT DETECTED
Proteus species: NOT DETECTED
STREPTOCOCCUS PNEUMONIAE: NOT DETECTED
STREPTOCOCCUS PYOGENES: NOT DETECTED
Serratia marcescens: NOT DETECTED
Staphylococcus aureus (BCID): NOT DETECTED
Staphylococcus species: DETECTED — AB
Streptococcus agalactiae: NOT DETECTED
Streptococcus species: NOT DETECTED

## 2016-05-07 LAB — GLUCOSE, CAPILLARY
Glucose-Capillary: 124 mg/dL — ABNORMAL HIGH (ref 65–99)
Glucose-Capillary: 107 mg/dL — ABNORMAL HIGH (ref 65–99)
Glucose-Capillary: 129 mg/dL — ABNORMAL HIGH (ref 65–99)
Glucose-Capillary: 145 mg/dL — ABNORMAL HIGH (ref 65–99)
Glucose-Capillary: 166 mg/dL — ABNORMAL HIGH (ref 65–99)

## 2016-05-07 MED ORDER — DEXAMETHASONE 4 MG PO TABS
4.0000 mg | ORAL_TABLET | Freq: Four times a day (QID) | ORAL | Status: DC
  Administered 2016-05-07 – 2016-05-09 (×7): 4 mg via ORAL
  Filled 2016-05-07 (×8): qty 1

## 2016-05-07 MED ORDER — AMLODIPINE BESYLATE 5 MG PO TABS
5.0000 mg | ORAL_TABLET | Freq: Every day | ORAL | Status: DC
  Administered 2016-05-07 – 2016-05-09 (×3): 5 mg via ORAL
  Filled 2016-05-07 (×3): qty 1

## 2016-05-07 MED ORDER — APIXABAN 2.5 MG PO TABS
2.5000 mg | ORAL_TABLET | Freq: Two times a day (BID) | ORAL | Status: DC
  Administered 2016-05-07 – 2016-05-09 (×4): 2.5 mg via ORAL
  Filled 2016-05-07 (×4): qty 1

## 2016-05-07 NOTE — Clinical Social Work Placement (Signed)
   CLINICAL SOCIAL WORK PLACEMENT  NOTE  Date:  05/07/2016  Patient Details  Name: Tanner Manning MRN: [id number] Date of Birth: 1933/07/19  Clinical Social Work is seeking post-discharge placement for this patient at the [place] level of care (*CSW will initial, date and re-position this form in  chart as items are completed):  Yes   Patient/family provided with [facility] Work Department's list of facilities offering this level of care within the geographic area requested by the patient (or if unable, by the patient's family).  Yes   Patient/family informed of their freedom to choose among providers that offer the needed level of care, that participate in Medicare, Medicaid or managed care program needed by the patient, have an available bed and are willing to accept the patient.  Yes   Patient/family informed of [facility]'s ownership interest in [facility] and [facility], as well as of the fact that they are under no obligation to receive care at these facilities.  PASRR submitted to EDS on       PASRR number received on       Existing PASRR number confirmed on       FL2 transmitted to all facilities in geographic area requested by pt/family on       FL2 transmitted to all facilities within larger geographic area on       Patient informed that his/her managed care company has contracts with or will negotiate with certain facilities, including the following:            Patient/family informed of bed offers received.  Patient chooses bed at       Physician recommends and patient chooses bed at      Patient to be transferred to   on  .  Patient to be transferred to facility by       Patient family notified on   of transfer.  Name of family member notified:        PHYSICIAN Please sign FL2     Additional Comment:    _______________________________________________ Wende Neighbors, LCSW 05/07/2016, 12:35 PM

## 2016-05-07 NOTE — Evaluation (Signed)
Physical Therapy Evaluation Patient Details Name: Pearly Apachito MRN: [id number] DOB: 02/03/1934 Today's Date: 05/07/2016   History of Present Illness  Pt is a 83 y.o. male with PMHx significant for Cancer, Depression, DM, High cholesterol, HTN, Renal disorder, Thyroid disease. Pt found confused at home by his family. MRI on 3/29 + for 44mm meningioma  Clinical Impression  Pt pleasant, agreeable to mobility and able to clearly express desire to improve mobility. Pt with decreased transfers, balance, gait and function with inaccessible home and limited family assist who will benefit from acute therapy to maximize mobility, function and gait to decrease burden of care and fall risk. Son present at beginning of session to provide home function and pt difficulties.     Follow Up Recommendations SNF;Supervision/Assistance - 24 hour    Equipment Recommendations  Rolling walker with 5" wheels    Recommendations for Other Services       Precautions / Restrictions Precautions Precautions: Fall Restrictions Weight Bearing Restrictions: No      Mobility  Bed Mobility Overal bed mobility: Needs Assistance Bed Mobility: Supine to Sit     Supine to sit: Min guard     General bed mobility comments: in chair on arrival  Transfers Overall transfer level: Needs assistance Equipment used: Rolling walker (2 wheeled) Transfers: Sit to/from Stand Sit to Stand: Min assist         General transfer comment: cues for hand placement and sequence from recliner and from toilet with rail   Ambulation/Gait Ambulation/Gait assistance: Min assist Ambulation Distance (Feet): 170 Feet Assistive device: Rolling walker (2 wheeled) Gait Pattern/deviations: Step-through pattern;Decreased stride length;Trunk flexed   Gait velocity interpretation: Below normal speed for age/gender General Gait Details: mod multimodal cues to step into RW, extend trunk and directional cues  Stairs             Wheelchair Mobility    Modified Rankin (Stroke Patients Only)       Balance Overall balance assessment: History of Falls Sitting-balance support: Feet supported;No upper extremity supported Sitting balance-Leahy Scale: Good     Standing balance support: Bilateral upper extremity supported Standing balance-Leahy Scale: Poor Standing balance comment: RW and mod assist for standing balance                             Pertinent Vitals/Pain Pain Assessment: No/denies pain    Home Living Family/patient expects to be discharged to:: Private residence Living Arrangements: Alone Available Help at Discharge: Family;Available PRN/intermittently Type of Home: House Home Access: Ramped entrance     Home Layout: One level Home Equipment: Walker - 2 wheels;Cane - single point;Bedside commode Additional Comments: 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    Prior Function Level of Independence: Independent with assistive device(s)         Comments: Per pt; he uses a cane for mobility and is independent with ADL. Drives, eats out for most meals, uses motorized cart in stores. Very limited gait     Hand Dominance        Extremity/Trunk Assessment   Upper Extremity Assessment Upper Extremity Assessment: Defer to OT evaluation    Lower Extremity Assessment Lower Extremity Assessment: Overall WFL for tasks assessed    Cervical / Trunk Assessment Cervical / Trunk Assessment: Kyphotic  Communication   Communication: HOH  Cognition Arousal/Alertness: Awake/alert Behavior During Therapy: WFL for tasks assessed/performed Overall Cognitive Status: Impaired/Different from  baseline Area of Impairment: Safety/judgement                         Safety/Judgement: Decreased awareness of deficits;Decreased awareness of safety     General Comments: pt answering questions appropriately and responding to commands. decreased  awareness of safety and home environment concerns      General Comments      Exercises     Assessment/Plan    PT Assessment Patient needs continued PT services  PT Problem List Decreased activity tolerance;Decreased balance;Decreased knowledge of use of DME;Decreased cognition;Decreased safety awareness;Decreased mobility       PT Treatment Interventions Gait training;DME instruction;Therapeutic activities;Balance training;Functional mobility training;Therapeutic exercise;Patient/family education    PT Goals (Current goals can be found in the Care Plan section)  Acute Rehab PT Goals Patient Stated Goal: be able to move around PT Goal Formulation: With patient Time For Goal Achievement: 05/21/16 Potential to Achieve Goals: Good    Frequency Min 3X/week   Barriers to discharge Decreased caregiver support      Co-evaluation               End of Session Equipment Utilized During Treatment: Gait belt Activity Tolerance: Patient tolerated treatment well Patient left: in chair;with call bell/phone within reach;with chair alarm set;with family/visitor present;with nursing/sitter in room Nurse Communication: Mobility status PT Visit Diagnosis: Difficulty in walking, not elsewhere classified (R26.2);History of falling (Z91.81)    Time: 1030-1051 PT Time Calculation (min) (ACUTE ONLY): 21 min   Charges:   PT Evaluation $PT Eval Moderate Complexity: 1 Procedure     PT G Codes:   PT G-Codes **NOT FOR INPATIENT CLASS** Functional Assessment Tool Used: AM-PAC 6 Clicks Basic Mobility;Clinical judgement Functional Limitation: Mobility: Walking and moving around Mobility: Walking and Moving Around Current Status (L8937): At least 20 percent but less than 40 percent impaired, limited or restricted Mobility: Walking and Moving Around Goal Status [phone]): At least 1 percent but less than 20 percent impaired, limited or restricted    Elwyn Reach, [place]   Sandy Salaam Triton Heidrich 05/07/2016, 12:28 PM

## 2016-05-07 NOTE — Progress Notes (Signed)
ANTICOAGULATION CONSULT NOTE - Initial Consult  Pharmacy Consult for Eliquis Indication: atrial fibrillation  No Known Allergies  Patient Measurements: Height: 6' (182.9 cm) Weight: 265 lb 6.9 oz (120.4 kg) IBW/kg (Calculated) : 77.6  Vital Signs: Temp: 98.6 F (37 C) (03/29 0500) Temp Source: Oral (03/29 0500) BP: 169/95 (03/29 0650) Pulse Rate: 115 (03/29 1050)  Labs:  Recent Labs  05/05/16 1850 05/06/16 0022 05/07/16 0303 05/07/16 0703  HGB 14.8 13.8  --  14.2  HCT 45.1 43.5  --  44.1  PLT 204 173  --  168  LABPROT 13.7  --   --   --   INR 1.05  --   --   --   CREATININE 1.78* 1.73* 1.66*  --   CKTOTAL 299  --   --   --   TROPONINI 0.03*  --   --   --    Estimated Creatinine Clearance: 46.7 mL/min (A) (by C-G formula based on SCr of 1.66 mg/dL (H)).  Medical History: Past Medical History:  Diagnosis Date  . Cancer [facility])    prostate and renal  . Depression   . Diabetes mellitus without complication ([place])   . Hard of hearing   . High cholesterol   . Hypertension   . Osteoporosis   . Renal disorder    kidney stone, stage 3 kidney disease  . Thyroid disease     Medications:  Prescriptions Prior to Admission  Medication Sig Dispense Refill Last Dose  . aspirin EC 81 MG tablet Take 81 mg by mouth daily.   Past Week at Unknown time  . citalopram (CELEXA) 40 MG tablet Take 40 mg by mouth daily.   Past Week at Unknown time  . glimepiride (AMARYL) 2 MG tablet Take 4 mg by mouth daily before breakfast.    Past Week at Unknown time  . labetalol (NORMODYNE) 200 MG tablet Take 200 mg by mouth 2 (two) times daily.   Past Week at Unknown time  . levothyroxine (SYNTHROID, LEVOTHROID) 175 MCG tablet Take 175 mcg by mouth daily before breakfast.    Past Week at Unknown time  . simvastatin (ZOCOR) 40 MG tablet Take 40 mg by mouth daily at 6 PM.   Past Week at Unknown time  . Triptorelin Pamoate (TRELSTAR) 22.5 MG injection Inject 22.5 mg into the muscle every 6 (six)  months.   Unknown at Unknown   Assessment: Tanner Bowersis a 83 y.o.malewith new onset atrial fibrillation (CHA2DS2-VASc is at least). No anticoagulation prior to admission. CBC stable. Will use reduced dose given age 64 and Scr 1.66. No bleeding documented.  Goal of Therapy:  aPTT 66-102 seconds Monitor platelets by anticoagulation protocol: Yes   Plan:  Eliquis 2.5mg  BID Monitor renal function and for s/sx of bleeding  Georga Bora, PharmD Clinical Pharmacist Pager: [phone] 05/07/2016 12:02 PM

## 2016-05-07 NOTE — Progress Notes (Addendum)
PROGRESS NOTE  Tanner Manning  VOZ:[id number] DOB: 08/18/1933 DOA: 05/05/2016 PCP: Manfred Shirts, PA Outpatient Specialists:  Subjective: Seen with his son at bedside, continue to have weakness, blood pressure still in the high side. Son describes that patient is not able to do his ADLs at home (he lives alone)  Brief Narrative:  Tanner Manning is a 83 y.o. male with medical history significant of noncompliance has not seen a doctor for years was found at home by his family confused.  Unknown what pts baseline is.  Was found to be running a fever.  He says he has been weak for several days.  He is hard of hearing and does seem to be a little confused but for the most part he knows he is in the hospital and it is 2018.  He denies any n/v/d.  No dysuria.  No cough.  No rashes.  Pt sent over from [facility] for fever.    Assessment & Plan:   Principal Problem:   Acute encephalopathy Active Problems:   Diabetes mellitus without complication (HCC)   Cancer (HCC)   Fever   Acute lower UTI   A-fib (HCC)   Creatinine elevation   Fever Unclear etiology, his CXR is negative, influenza swab is negative, his maximum temperature was 100.5. Been treated for presumed UTI, his UA showed some bacteria. Continue current antibiotics and follow urine culture. Blood culture is negative.  Atrial fibrillation, new onset Not documented before, presumed to be new, currently rate is controlled with beta blockers. CHA2DS2-VASc is at least 4, HTN, DM, and 2+ for his age, no history of falls, he will need anticoagulation. We will start Eliquis. Get 2-D echo,   Accelerated hypertension Blood pressure is 203/127 this morning, giving hydralazine, restarted on his labetalol. CT scan of the head showed no acute findings. Control his blood pressure with labetalol and hydralazine as needed, started on Norvasc.  Meningioma -MRI of the brain showed 16 mm meningioma with moderate vasogenic edema. -This is without midline  shift, likely his complaints is not part of that. I will start short course of Decadron.  Diabetes mellitus Hemoglobin A1c is 5.6, Carbohydrate modified diet and insulin sliding scale.  Confusion -Preadmission he was hard of hearing but somewhat confused, he knows where he had cannot remember a lot. With findings of atrial fibrillation, accelerated hypertension and confusion wall check MRI to rule out CVA.   HOH  DVT prophylaxis:  Code Status: Full Code Family Communication:  Disposition Plan:  Diet: Diet Heart Room service appropriate? Yes; Fluid consistency: Thin  Consultants:   None  Procedures:   None  Antimicrobials:   None   Objective: Vitals:   05/07/16 0500 05/07/16 0530 05/07/16 0650 05/07/16 1050  BP: (!) 152/117 (!) 174/110 (!) 169/95   Pulse: (!) 50   (!) 115  Resp: 20     Temp: 98.6 F (37 C)     TempSrc: Oral     SpO2: 93%     Weight:      Height:        Intake/Output Summary (Last 24 hours) at 05/07/16 1100 Last data filed at 05/07/16 0500  Gross per 24 hour  Intake          1691.25 ml  Output              900 ml  Net           791.25 ml   Filed Weights   05/05/16 1841 05/05/16 2254  Weight: 99.8 kg (220 lb) 120.4 kg (265 lb 6.9 oz)    Examination: General exam: Appears calm and comfortable  Respiratory system: Clear to auscultation. Respiratory effort normal. Cardiovascular system: S1 & S2 heard, RRR. No JVD, murmurs, rubs, gallops or clicks. No pedal edema. Gastrointestinal system: Abdomen is nondistended, soft and nontender. No organomegaly or masses felt. Normal bowel sounds heard. Central nervous system: Alert and oriented. No focal neurological deficits. Extremities: Symmetric 5 x 5 power. Skin: No rashes, lesions or ulcers Psychiatry: Judgement and insight appear normal. Mood & affect appropriate.   Data Reviewed: I have personally reviewed following labs and imaging studies  CBC:  Recent Labs Lab 05/05/16 1850  05/06/16 0022 05/07/16 0703  WBC 11.5* 8.9 8.2  NEUTROABS 10.1*  --   --   HGB 14.8 13.8 14.2  HCT 45.1 43.5 44.1  MCV 90.4 91.0 90.6  PLT 204 173 644   Basic Metabolic Panel:  Recent Labs Lab 05/05/16 1850 05/06/16 0022 05/07/16 0303  NA 135 135 136  K 4.4 4.3 4.5  CL 101 100* 100*  CO2 23 26 27   GLUCOSE 164* 123* 111*  BUN 26* 23* 22*  CREATININE 1.78* 1.73* 1.66*  CALCIUM 9.1 9.1 9.0   GFR: Estimated Creatinine Clearance: 46.7 mL/min (A) (by C-G formula based on SCr of 1.66 mg/dL (H)). Liver Function Tests:  Recent Labs Lab 05/05/16 1850  AST 29  ALT 20  ALKPHOS 59  BILITOT 0.9  PROT 7.7  ALBUMIN 4.1    Recent Labs Lab 05/05/16 1850  LIPASE 13    Recent Labs Lab 05/05/16 1957  AMMONIA 38*   Coagulation Profile:  Recent Labs Lab 05/05/16 1850  INR 1.05   Cardiac Enzymes:  Recent Labs Lab 05/05/16 1850  CKTOTAL 299  TROPONINI 0.03*   BNP (last 3 results) No results for input(s): PROBNP in the last 8760 hours. HbA1C:  Recent Labs  05/06/16 0100  HGBA1C 5.6   CBG:  Recent Labs Lab 05/06/16 0602 05/06/16 1108 05/06/16 1645 05/06/16 2143 05/07/16 0604  GLUCAP 119* 131* 112* 124* 107*   Lipid Profile: No results for input(s): CHOL, HDL, LDLCALC, TRIG, CHOLHDL, LDLDIRECT in the last 72 hours. Thyroid Function Tests:  Recent Labs  05/06/16 0022  TSH 2.889   Anemia Panel: No results for input(s): VITAMINB12, FOLATE, FERRITIN, TIBC, IRON, RETICCTPCT in the last 72 hours. Urine analysis:    Component Value Date/Time   COLORURINE YELLOW 05/05/2016 1849   APPEARANCEUR CLOUDY (A) 05/05/2016 1849   LABSPEC 1.018 05/05/2016 1849   PHURINE 5.5 05/05/2016 1849   GLUCOSEU NEGATIVE 05/05/2016 1849   HGBUR MODERATE (A) 05/05/2016 1849   BILIRUBINUR NEGATIVE 05/05/2016 [place] 05/05/2016 1849   PROTEINUR 100 (A) 05/05/2016 1849   NITRITE NEGATIVE 05/05/2016 1849   LEUKOCYTESUR TRACE (A) 05/05/2016 1849    Sepsis Labs: @LABRCNTIP (procalcitonin:4,lacticidven:4)  ) Recent Results (from the past 240 hour(s))  Culture, blood (Routine X 2) w Reflex to ID Panel     Status: None (Preliminary result)   Collection Time: 05/05/16  7:45 PM  Result Value Ref Range Status   Specimen Description BLOOD LEFT AC  Final   Special Requests BOTTLES DRAWN AEROBIC AND ANAEROBIC 5CC EACH  Final   Culture   Final    NO GROWTH < 24 HOURS Performed at [facility], [place] [address] [place], [place] [address]    Report Status PENDING  Incomplete  Culture, blood (Routine X 2) w Reflex to  ID Panel     Status: None (Preliminary result)   Collection Time: 05/05/16  9:02 PM  Result Value Ref Range Status   Specimen Description BLOOD RIGHT [facility]  Final   Special Requests BOTTLES DRAWN AEROBIC AND ANAEROBIC 5CC EACH  Final   Culture   Final    NO GROWTH < 24 HOURS Performed at [facility], [place] [address] [place], [place] [address]    Report Status PENDING  Incomplete     Invalid input(s): PROCALCITONIN, LACTICACIDVEN   Radiology Studies: Dg Chest 2 View  Result Date: 05/05/2016 CLINICAL DATA:  Failure to thrive. Incontinence. Memory loss. Chest pain. EXAM: CHEST  2 VIEW COMPARISON:  None. FINDINGS: Both lateral views are degraded by patient positioning, including the arms not being raised above the head. Moderate thoracic spondylosis. Mild to moderate right hemidiaphragm elevation. Apical lordotic frontal radiograph. Midline trachea. Patient rotated minimally right on the frontal. Cardiomegaly. Mildly tortuous thoracic aorta. No pleural effusion or pneumothorax. No congestive failure. IMPRESSION: No acute cardiopulmonary disease. Cardiomegaly without congestive failure. Electronically Signed   By: Abigail Miyamoto M.D.   On: 05/05/2016 20:15   Ct Head Wo Contrast  Result Date: 05/05/2016 CLINICAL DATA:  Status post fall, with concern for head injury. Initial encounter. EXAM: CT HEAD WITHOUT CONTRAST  TECHNIQUE: Contiguous axial images were obtained from the base of the skull through the vertex without intravenous contrast. COMPARISON:  None. FINDINGS: Brain: No evidence of acute infarction, hemorrhage, hydrocephalus, extra-axial collection or mass lesion/mass effect. Prominence of the ventricles and sulci reflects mild to moderate cortical volume loss. Diffuse periventricular and subcortical white matter change likely reflects small vessel ischemic microangiopathy. The brainstem and fourth ventricle are within normal limits. The basal ganglia are unremarkable in appearance. The cerebral hemispheres demonstrate grossly normal gray-white differentiation. No mass effect or midline shift is seen. Vascular: No hyperdense vessel or unexpected calcification. Skull: There is no evidence of fracture; visualized osseous structures are unremarkable in appearance. Sinuses/Orbits: The orbits are within normal limits. The paranasal sinuses and mastoid air cells are well-aerated. Other: No significant soft tissue abnormalities are seen. IMPRESSION: 1. No evidence of traumatic intracranial injury or fracture. 2. Mild to moderate cortical volume loss and diffuse small vessel ischemic microangiopathy. Electronically Signed   By: Garald Balding M.D.   On: 05/05/2016 23:53   Mr Jeri Cos BT Contrast  Result Date: 05/06/2016 CLINICAL DATA:  Confusion. Fever. Atrial fibrillation and accelerated hypertension. History of prostate and renal cancer. EXAM: MRI HEAD WITHOUT AND WITH CONTRAST TECHNIQUE: Multiplanar, multiecho pulse sequences of the brain and surrounding structures were obtained without and with intravenous contrast. CONTRAST:  66mL MULTIHANCE GADOBENATE DIMEGLUMINE 529 MG/ML IV SOLN COMPARISON:  Head CT 05/05/2016 FINDINGS: Multiple sequences are mildly to moderately motion degraded. Brain: There is no evidence of acute infarct, intracranial hemorrhage, midline shift, or extra-axial fluid collection. There is mild  cerebral atrophy. Confluent T2 hyperintensities in the deep cerebral white matter bilaterally are nonspecific but compatible with advanced chronic small vessel ischemic disease. There is an avidly enhancing mass inferior to the left frontal lobe along the floor of the anterior cranial fossa which appears extra-axial and dural based and measures 16 x 15 x 13 mm (transverse x AP x craniocaudal). There is moderate vasogenic edema in the adjacent left frontal lobe. No other enhancing lesions are identified. Vascular: Major intracranial vascular flow voids are preserved. Skull and upper cervical spine: Unremarkable bone marrow signal. Sinuses/Orbits: Bilateral cataract extraction. Minimal scattered paranasal  sinus mucosal thickening. Clear mastoid air cells. Other: None. IMPRESSION: 1. 16 mm mass along the floor of the left anterior cranial fossa most consistent with meningioma. Moderate left frontal lobe edema. 2. No acute infarct. 3. Advanced chronic small vessel ischemic disease. Electronically Signed   By: Logan Bores M.D.   On: 05/06/2016 13:55     Scheduled Meds: . amLODipine  5 mg Oral Daily  . aspirin  325 mg Oral Daily  . cefTRIAXone (ROCEPHIN)  IV  1 g Intravenous Q24H  . insulin aspart  0-9 Units Subcutaneous TID WC  . labetalol  200 mg Oral BID  . sodium chloride flush  3 mL Intravenous Q12H   Continuous Infusions: . sodium chloride 75 mL/hr at 05/07/16 0550     LOS: 0 days    Time spent: 35 minutes    Vicci Reder A, MD [facility] Pager [phone]  If 7PM-7AM, please contact night-coverage [email or web address] Password [email or web address] 05/07/2016, 11:00 AM

## 2016-05-07 NOTE — NC FL2 (Signed)
[place] LEVEL OF CARE SCREENING TOOL     IDENTIFICATION  Patient Name: Tanner Manning Birthdate: 10/01/1933 Sex: male Admission Date (Current Location): 05/05/2016  [facility] and [place] Number:  Herbalist and Address:  [facility], [place] [address] [place], [place] [address]      Provider Number: [id number]  Attending Physician Name and Address:  Verlee Monte, MD  Relative Name and Phone Number:  Tri [place]    Current Level of Care: Hospital Recommended Level of Care: [place] Prior Approval Number:    Date Approved/Denied:   PASRR Number: [id number]  Discharge Plan: SNF    Current Diagnoses: Patient Active Problem List   Diagnosis Date Noted  . Creatinine elevation 05/06/2016  . Acute encephalopathy 05/05/2016  . Fever 05/05/2016  . Acute lower UTI 05/05/2016  . A-fib ([place]) 05/05/2016  . Diabetes mellitus without complication ([place])   . Cancer ([place])     Orientation RESPIRATION BLADDER Height & Weight     Self, Time  Normal Incontinent Weight: 265 lb 6.9 oz (120.4 kg) Height:  6' (182.9 cm)  BEHAVIORAL SYMPTOMS/MOOD NEUROLOGICAL BOWEL NUTRITION STATUS      Continent Diet (Heart Healthy)  AMBULATORY STATUS COMMUNICATION OF NEEDS Skin   Limited Assist Verbally Normal                       Personal Care Assistance Level of Assistance  Bathing, Feeding, Dressing Bathing Assistance: Limited assistance Feeding assistance: Independent Dressing Assistance: Limited assistance     Functional Limitations Info  Sight, Hearing, Speech Sight Info: Adequate Hearing Info: Impaired Speech Info: Adequate    SPECIAL CARE FACTORS FREQUENCY  OT (By licensed OT), PT (By licensed PT)     PT Frequency: 5x week OT Frequency: 5x week            Contractures Contractures Info: Not present    Additional Factors Info  Code Status Code Status Info: Full Code             Current Medications  (05/07/2016):  This is the current hospital active medication list Current Facility-Administered Medications  Medication Dose Route Frequency Provider Last Rate Last Dose  . amLODipine (NORVASC) tablet 5 mg  5 mg Oral Daily Verlee Monte, MD   5 mg at 05/07/16 1050  . apixaban (ELIQUIS) tablet 2.5 mg  2.5 mg Oral BID Verlee Monte, MD      . cefTRIAXone (ROCEPHIN) 1 g in dextrose 5 % 50 mL IVPB  1 g Intravenous Q24H Phillips Grout, MD   1 g at 05/06/16 2333  . dexamethasone (DECADRON) tablet 4 mg  4 mg Oral Q6H Mutaz Elmahi, MD      . hydrALAZINE (APRESOLINE) injection 10 mg  10 mg Intravenous Q4H PRN Norval Morton, MD   10 mg at 05/07/16 9518  . insulin aspart (novoLOG) injection 0-9 Units  0-9 Units Subcutaneous TID WC Phillips Grout, MD   1 Units at 05/06/16 1148  . labetalol (NORMODYNE) tablet 200 mg  200 mg Oral BID Verlee Monte, MD   200 mg at 05/07/16 1050  . sodium chloride flush (NS) 0.9 % injection 3 mL  3 mL Intravenous Q12H Phillips Grout, MD   3 mL at 05/06/16 1000  . sodium chloride flush (NS) 0.9 % injection 3 mL  3 mL Intravenous PRN Phillips Grout, MD         Discharge Medications:  Please see discharge summary for a list of discharge medications.  Relevant Imaging Results:  Relevant Lab Results:   Additional Information [id number]  Wende Neighbors, LCSW

## 2016-05-07 NOTE — Evaluation (Signed)
Occupational Therapy Evaluation Patient Details Name: Tanner Manning MRN: [id number] DOB: Sep 12, 1933 Today's Date: 05/07/2016    History of Present Illness Pt is a 83 y.o. male with PMHx significant for Cancer, Depression, DM, High cholesterol, HTN, Renal disorder, Thyroid disease. Pt found confused at home by his family. MRI on 3/29 + for 6mm mass along the floor of the left anterior cranial fossa.   Clinical Impression   Per pt; he was independent with ADL and used a cane for mobility PTA. Currently pt requires mod assist for functional mobility, min assist for seated UB ADL, and mod assist for LB ADL. Pt presenting with impaired balance, generalized weakness, ?impaired cognition impacting his independence and safety with ADL and functional mobility. Recommending SNF for follow up to maximize independence and safety with ADL and functional mobility prior to return home alone. Pt would benefit from continued skilled OT to address established goals.    Follow Up Recommendations  SNF;Supervision/Assistance - 24 hour    Equipment Recommendations  Other (comment) (TBD at next venue)    Recommendations for Other Services PT consult     Precautions / Restrictions Precautions Precautions: Fall Restrictions Weight Bearing Restrictions: No      Mobility Bed Mobility Overal bed mobility: Needs Assistance Bed Mobility: Supine to Sit     Supine to sit: Min guard     General bed mobility comments: For safety; no phyiscal assist required. HOB flat with use of bed rails. Increased time required.  Transfers Overall transfer level: Needs assistance Equipment used: Rolling walker (2 wheeled) Transfers: Sit to/from Stand Sit to Stand: Mod assist         General transfer comment: Mod assist to boost up from EOB x1. Cues for hand placement. Increased time.    Balance Overall balance assessment: Needs assistance;History of Falls Sitting-balance support: Feet supported;No upper extremity  supported Sitting balance-Leahy Scale: Good     Standing balance support: Bilateral upper extremity supported Standing balance-Leahy Scale: Poor Standing balance comment: RW and mod assist for standing balance                           ADL either performed or assessed with clinical judgement   ADL Overall ADL's : Needs assistance/impaired Eating/Feeding: Set up;Sitting   Grooming: Set up;Supervision/safety;Sitting;Wash/dry face   Upper Body Bathing: Minimal assistance;Sitting   Lower Body Bathing: Moderate assistance;Sit to/from stand   Upper Body Dressing : Minimal assistance;Sitting Upper Body Dressing Details (indicate cue type and reason): to don hospital gown Lower Body Dressing: Moderate assistance;Sit to/from stand   Toilet Transfer: Moderate assistance;Ambulation;RW Toilet Transfer Details (indicate cue type and reason): Simulated by sit to stand from EOB with functional mobility in room. Pt able to sit in chair and use urinal.         Functional mobility during ADLs: Moderate assistance;Rolling walker General ADL Comments: Discussed post acute rehab with son; he is agreeable. Pt found incontinent of urine; assisted with cleaning and changing gown. Pt and son report pt is incontinent of bowel and bladder.     Vision         Perception     Praxis      Pertinent Vitals/Pain Pain Assessment: No/denies pain     Hand Dominance     Extremity/Trunk Assessment Upper Extremity Assessment Upper Extremity Assessment: Generalized weakness   Lower Extremity Assessment Lower Extremity Assessment: Defer to PT evaluation   Cervical / Trunk Assessment Cervical / Trunk Assessment:  Kyphotic   Communication Communication Communication: HOH   Cognition Arousal/Alertness: Awake/alert Behavior During Therapy: WFL for tasks assessed/performed Overall Cognitive Status: Difficult to assess                                 General Comments: Pt  A&O x4. Appropriately responds to questions and follows commands. Very HOH.   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      Prior Functioning/Environment Level of Independence: Independent with assistive device(s)        Comments: Per pt; he uses a cane for mobility and is independent with ADL.         OT Problem List: Decreased strength;Decreased activity tolerance;Impaired balance (sitting and/or standing);Decreased cognition;Decreased safety awareness;Decreased knowledge of use of DME or AE;Cardiopulmonary status limiting activity;Obesity      OT Treatment/Interventions: Self-care/ADL training;Therapeutic exercise;Energy conservation;DME and/or AE instruction;Therapeutic activities;Patient/family education;Balance training    OT Goals(Current goals can be found in the care plan section) Acute Rehab OT Goals Patient Stated Goal: get better OT Goal Formulation: With patient/family Time For Goal Achievement: 05/21/16 Potential to Achieve Goals: Good ADL Goals Pt Will Perform Grooming: with supervision;standing Pt Will Perform Upper Body Bathing: with supervision;sitting Pt Will Perform Lower Body Bathing: with supervision;sit to/from stand (with or without AE) Pt Will Transfer to Toilet: with supervision;ambulating;bedside commode (over toilet) Pt Will Perform Toileting - Clothing Manipulation and hygiene: with supervision;sit to/from stand  OT Frequency: Min 2X/week    Barriers to D/C: Inaccessible home environment;Decreased caregiver support  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 Pt lives alone but does have supportive family       Co-evaluation              End of Session Equipment Utilized During Treatment: Gait belt;Rolling walker Nurse Communication: Mobility status  Activity Tolerance: Patient tolerated treatment well Patient left: in chair;with call bell/phone within reach;with chair alarm set;with family/visitor present  OT Visit Diagnosis: Unsteadiness on feet (R26.81);Other abnormalities of gait and mobility (R26.89);History of falling (Z91.81)                Time: 4944-9675 OT Time Calculation (min): 30 min Charges:  OT General Charges $OT Visit: 1 Procedure OT Evaluation $OT Eval Moderate Complexity: 1 Procedure OT Treatments $Self Care/Home Management : 8-22 mins G-Codes: OT G-codes **NOT FOR INPATIENT CLASS** Functional Assessment Tool Used: AM-PAC 6 Clicks Daily Activity Functional Limitation: Self care Self Care Current Status (F1638): At least 40 percent but less than 60 percent impaired, limited or restricted Self Care Goal Status (G6659): At least 1 percent but less than 20 percent impaired, limited or restricted   Mel Almond A. Ulice Brilliant, M.S., OTR/L Pager: [place] 05/07/2016, 10:09 AM

## 2016-05-07 NOTE — Clinical Social Work Note (Signed)
Clinical Social Work Assessment  Patient Details  Name: Tanner Manning MRN: [id number] Date of Birth: 23-May-1933  Date of referral:  05/07/16               Reason for consult:  Discharge Planning                Permission sought to share information with:  Family Supports Permission granted to share information::  Yes, Verbal Permission Granted  Name::     Tanner Manning  Agency::     Relationship::  son  Contact Information:  [phone]  Housing/Transportation Living arrangements for the past 2 months:  Single Family Home Source of Information:  Adult Children Patient Interpreter Needed:  None Criminal Activity/Legal Involvement Pertinent to Current Situation/Hospitalization:  No - Comment as needed Significant Relationships:  Adult Children Lives with:  Self Do you feel safe going back to the place where you live?  No (per son,pateint is a hoarder and living unsanitary condition ) Need for family participation in patient care:  Yes (Comment)  Care giving concerns: Per son's statement, patient lives at home by himself. 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  Social Worker assessment / plan:  CSW unable to assess patient. CSW spoke to patients son Tanner Manning. Tanner Manning stated that for the past several years patient has been living in unsanitary conditions. Patient has been hoarding for several years and not taking care of himself. Patient lives in a home with no running water of electricity. Tanner Manning is agreeable for patient to discharge to a SNF but would prefer [place] or [place] area to be closer to other family members. CSW to complete necessary paperwork and initiate SNF search on patients behalf. CSW to follow up with family once bed offers are available.    Employment status:  Retired Forensic scientist:  Medicare PT Recommendations:  [place] / Referral to community resources:  [place]  Patient/Family's Response to care: Family verbalized appreciation and understanding for CSW role and involvement in care. Family agreeable with current discharge plan to SNF  Patient/Family's Understanding of and Emotional Response to Diagnosis, Current Treatment, and Prognosis:  Family with good understanding of current medical state and limitations around most recent hospitalization. Emotional Assessment Appearance:  Appears stated age Attitude/Demeanor/Rapport:  Unable to Assess Affect (typically observed):  Unable to Assess Orientation:  Oriented to  Time, Oriented to Situation Alcohol / Substance use:  Not Applicable Psych involvement (Current and /or in the community):  No (Comment)  Discharge Needs  Concerns to be addressed:  Home Safety Concerns (per son patient living in [place] condition ) Readmission within the last 30 days:  No Current discharge risk:  None Barriers to Discharge:  No Barriers Identified   Wende Neighbors, [place] 05/07/2016, 12:27 PM

## 2016-05-07 NOTE — Care Management Note (Signed)
Case Management Note (Original Note by): Marvetta Gibbons RN, BSN Unit 2W-Case Manager [phone]  Patient Details  Name: Tanner Manning MRN: [id number] Date of Birth: [birth year]  Subjective/Objective:   Pt presented with fever, acute encephalopathy                 Action/Plan: PTA pt lived at home alone, per conversation with pt he states that he has family nearby, pt also reports that he has a cane and walker at home. Pt has not seen a doctor in years.  PT/OT evals pending- CM to follow for d/c needs  Expected Discharge Date:                  Expected Discharge Plan:  [place]  In-House Referral:  Clinical Social Work  Discharge planning Services  CM Consult  Post Acute Care Choice:  NA Choice offered to:  NA  DME Arranged:    DME Agency:     HH Arranged:    [place] Agency:     Status of Service:  In process, will continue to follow  If discussed at Long Length of Stay Meetings, dates discussed:    Additional Comments:  05/07/16 PT/OT recommending SNF upon dc; CSW consulted to facilitate dc to SNF for rehab.  Will follow progress.  [name], RN, BSN  [name], RN 05/07/2016, 2:37 PM

## 2016-05-07 NOTE — Progress Notes (Signed)
PHARMACY - PHYSICIAN COMMUNICATION CRITICAL VALUE ALERT - BLOOD CULTURE IDENTIFICATION (BCID)  Results for orders placed or performed during the hospital encounter of 05/05/16  Blood Culture ID Panel (Reflexed) (Collected: 05/05/2016  7:45 PM)  Result Value Ref Range   Enterococcus species NOT DETECTED NOT DETECTED   Listeria monocytogenes NOT DETECTED NOT DETECTED   Staphylococcus species DETECTED (A) NOT DETECTED   Staphylococcus aureus NOT DETECTED NOT DETECTED   Methicillin resistance NOT DETECTED NOT DETECTED   Streptococcus species NOT DETECTED NOT DETECTED   Streptococcus agalactiae NOT DETECTED NOT DETECTED   Streptococcus pneumoniae NOT DETECTED NOT DETECTED   Streptococcus pyogenes NOT DETECTED NOT DETECTED   Acinetobacter baumannii NOT DETECTED NOT DETECTED   Enterobacteriaceae species NOT DETECTED NOT DETECTED   Enterobacter cloacae complex NOT DETECTED NOT DETECTED   Escherichia coli NOT DETECTED NOT DETECTED   Klebsiella oxytoca NOT DETECTED NOT DETECTED   Klebsiella pneumoniae NOT DETECTED NOT DETECTED   Proteus species NOT DETECTED NOT DETECTED   Serratia marcescens NOT DETECTED NOT DETECTED   Haemophilus influenzae NOT DETECTED NOT DETECTED   Neisseria meningitidis NOT DETECTED NOT DETECTED   Pseudomonas aeruginosa NOT DETECTED NOT DETECTED   Candida albicans NOT DETECTED NOT DETECTED   Candida glabrata NOT DETECTED NOT DETECTED   Candida krusei NOT DETECTED NOT DETECTED   Candida parapsilosis NOT DETECTED NOT DETECTED   Candida tropicalis NOT DETECTED NOT DETECTED    Name of physician (or Provider) Contacted: [place]  Changes to prescribed antibiotics required: Continue current ceftriaxone for now.  Erin Hearing PharmD., BCPS Clinical Pharmacist Pager [phone] 05/07/2016 9:39 PM

## 2016-05-08 ENCOUNTER — Inpatient Hospital Stay (HOSPITAL_COMMUNITY): Payer: Medicare HMO

## 2016-05-08 DIAGNOSIS — I509 Heart failure, unspecified: Secondary | ICD-10-CM

## 2016-05-08 LAB — ECHOCARDIOGRAM COMPLETE
FS: 34 % (ref 28–44)
Height: 72 in
IVS/LV PW RATIO, ED: 0.99
LA ID, A-P, ES: 49 mm
LADIAMINDEX: 2.04 cm/m2
LAVOLA4C: 91.2 mL
LDCA: 3.14 cm2
LEFT ATRIUM END SYS DIAM: 49 mm
LV PW d: 14.5 mm — AB (ref 0.6–1.1)
LVOTD: 20 mm
RV LATERAL S' VELOCITY: 266 cm/s
WEIGHTICAEL: 4246.94 [oz_av]

## 2016-05-08 LAB — GLUCOSE, CAPILLARY
GLUCOSE-CAPILLARY: 136 mg/dL — AB (ref 65–99)
GLUCOSE-CAPILLARY: 143 mg/dL — AB (ref 65–99)
GLUCOSE-CAPILLARY: 197 mg/dL — AB (ref 65–99)

## 2016-05-08 NOTE — Progress Notes (Signed)
PROGRESS NOTE  Tanner Manning  OVZ:[id number] DOB: 04-03-1933 DOA: 05/05/2016 PCP: Manfred Shirts, PA Outpatient Specialists:  Subjective: Denies any complaints this morning,  Brief Narrative:  Tanner Manning is a 83 y.o. male with medical history significant of noncompliance has not seen a doctor for years was found at home by his family confused.  Unknown what pts baseline is.  Was found to be running a fever.  He says he has been weak for several days.  He is hard of hearing and does seem to be a little confused but for the most part he knows he is in the hospital and it is 2018.  He denies any n/v/d.  No dysuria.  No cough.  No rashes.  Pt sent over from [facility] for fever.    Assessment & Plan:   Principal Problem:   Acute encephalopathy Active Problems:   Diabetes mellitus without complication (HCC)   Cancer (HCC)   Fever   Acute lower UTI   A-fib (HCC)   Creatinine elevation   Fever Unclear etiology, his CXR is negative, influenza swab is negative, his maximum temperature was 100.5. Been treated for presumed UTI, his UA showed some bacteria. Continue current antibiotics and follow urine culture. Blood culture is negative.  Atrial fibrillation, new onset Not documented before, presumed to be new, currently rate is controlled with beta blockers. CHA2DS2-VASc is at least 4, HTN, DM, and 2+ for his age, no history of falls, he will need anticoagulation. We will start Eliquis. Get 2-D echo, echo pending, likely to be discharged in a.m.   Accelerated hypertension Blood pressure is 203/127 this morning, giving hydralazine, restarted on his labetalol. CT scan of the head showed no acute findings. Blood pressure more controlled, on labetalol, Norvasc and hydralazine as needed.  Meningioma -MRI of the brain showed 16 mm meningioma with moderate vasogenic edema. This is discussed with his son. -This is without midline shift, likely his complaints is not part of that. I will start short  course of Decadron.  Diabetes mellitus Hemoglobin A1c is 5.6, Carbohydrate modified diet and insulin sliding scale.  Confusion -Preadmission he was hard of hearing but somewhat confused, he knows where he had cannot remember a lot. With findings of atrial fibrillation, accelerated hypertension and confusion wall check MRI to rule out CVA.   HOH -Per his son this is stable condition.   DVT prophylaxis:  Code Status: Full Code Family Communication:  Disposition Plan:  Diet: Diet Heart Room service appropriate? Yes; Fluid consistency: Thin  Consultants:   None  Procedures:   None  Antimicrobials:   None   Objective: Vitals:   05/07/16 1334 05/07/16 2200 05/08/16 0315 05/08/16 1014  BP: 118/76 (!) 167/88 (!) 145/94 (!) 145/87  Pulse: 75 94 89 90  Resp: 18 20 20    Temp:  98 F (36.7 C) 97.8 F (36.6 C)   TempSrc:  Oral Oral   SpO2: 97% 95% 94%   Weight:      Height:        Intake/Output Summary (Last 24 hours) at 05/08/16 1117 Last data filed at 05/08/16 1013  Gross per 24 hour  Intake             1293 ml  Output             1600 ml  Net             -307 ml   Filed Weights   05/05/16 1841 05/05/16 2254  Weight: 99.8  kg (220 lb) 120.4 kg (265 lb 6.9 oz)    Examination: General exam: Appears calm and comfortable  Respiratory system: Clear to auscultation. Respiratory effort normal. Cardiovascular system: S1 & S2 heard, RRR. No JVD, murmurs, rubs, gallops or clicks. No pedal edema. Gastrointestinal system: Abdomen is nondistended, soft and nontender. No organomegaly or masses felt. Normal bowel sounds heard. Central nervous system: Alert and oriented. No focal neurological deficits. Extremities: Symmetric 5 x 5 power. Skin: No rashes, lesions or ulcers Psychiatry: Judgement and insight appear normal. Mood & affect appropriate.   Data Reviewed: I have personally reviewed following labs and imaging studies  CBC:  Recent Labs Lab 05/05/16 1850  05/06/16 0022 05/07/16 0703  WBC 11.5* 8.9 8.2  NEUTROABS 10.1*  --   --   HGB 14.8 13.8 14.2  HCT 45.1 43.5 44.1  MCV 90.4 91.0 90.6  PLT 204 173 144   Basic Metabolic Panel:  Recent Labs Lab 05/05/16 1850 05/06/16 0022 05/07/16 0303  NA 135 135 136  K 4.4 4.3 4.5  CL 101 100* 100*  CO2 23 26 27   GLUCOSE 164* 123* 111*  BUN 26* 23* 22*  CREATININE 1.78* 1.73* 1.66*  CALCIUM 9.1 9.1 9.0   GFR: Estimated Creatinine Clearance: 46.7 mL/min (A) (by C-G formula based on SCr of 1.66 mg/dL (H)). Liver Function Tests:  Recent Labs Lab 05/05/16 1850  AST 29  ALT 20  ALKPHOS 59  BILITOT 0.9  PROT 7.7  ALBUMIN 4.1    Recent Labs Lab 05/05/16 1850  LIPASE 13    Recent Labs Lab 05/05/16 1957  AMMONIA 38*   Coagulation Profile:  Recent Labs Lab 05/05/16 1850  INR 1.05   Cardiac Enzymes:  Recent Labs Lab 05/05/16 1850  CKTOTAL 299  TROPONINI 0.03*   BNP (last 3 results) No results for input(s): PROBNP in the last 8760 hours. HbA1C:  Recent Labs  05/06/16 0100  HGBA1C 5.6   CBG:  Recent Labs Lab 05/07/16 1111 05/07/16 1608 05/07/16 2155 05/08/16 0650 05/08/16 1054  GLUCAP 129* 145* 166* 136* 143*   Lipid Profile: No results for input(s): CHOL, HDL, LDLCALC, TRIG, CHOLHDL, LDLDIRECT in the last 72 hours. Thyroid Function Tests:  Recent Labs  05/06/16 0022  TSH 2.889   Anemia Panel: No results for input(s): VITAMINB12, FOLATE, FERRITIN, TIBC, IRON, RETICCTPCT in the last 72 hours. Urine analysis:    Component Value Date/Time   COLORURINE YELLOW 05/05/2016 1849   APPEARANCEUR CLOUDY (A) 05/05/2016 1849   LABSPEC 1.018 05/05/2016 1849   PHURINE 5.5 05/05/2016 1849   GLUCOSEU NEGATIVE 05/05/2016 1849   HGBUR MODERATE (A) 05/05/2016 1849   BILIRUBINUR NEGATIVE 05/05/2016 [place] 05/05/2016 1849   PROTEINUR 100 (A) 05/05/2016 1849   NITRITE NEGATIVE 05/05/2016 1849   LEUKOCYTESUR TRACE (A) 05/05/2016 1849    Sepsis Labs: @LABRCNTIP (procalcitonin:4,lacticidven:4)  ) Recent Results (from the past 240 hour(s))  Culture, blood (Routine X 2) w Reflex to ID Panel     Status: None (Preliminary result)   Collection Time: 05/05/16  7:45 PM  Result Value Ref Range Status   Specimen Description BLOOD LEFT AC  Final   Special Requests BOTTLES DRAWN AEROBIC AND ANAEROBIC 5CC EACH  Final   Culture  Setup Time   Final    GRAM POSITIVE COCCI IN CLUSTERS AEROBIC BOTTLE ONLY Organism ID to follow CRITICAL RESULT CALLED TO, READ BACK BY AND VERIFIED WITHTillman Sers [facility] 05/07/16 A BROWNING    Culture  Final    NO GROWTH 2 DAYS Performed at [facility], [place] [address], [place], [place] [address]    Report Status PENDING  Incomplete  Blood Culture ID Panel (Reflexed)     Status: Abnormal   Collection Time: 05/05/16  7:45 PM  Result Value Ref Range Status   Enterococcus species NOT DETECTED NOT DETECTED Final   Listeria monocytogenes NOT DETECTED NOT DETECTED Final   Staphylococcus species DETECTED (A) NOT DETECTED Final    Comment: Methicillin (oxacillin) susceptible coagulase negative staphylococcus. Possible blood culture contaminant (unless isolated from more than one blood culture draw or clinical case suggests pathogenicity). No antibiotic treatment is indicated for blood  culture contaminants. CRITICAL RESULT CALLED TO, READ BACK BY AND VERIFIED WITH: Tillman Sers [facility] 2133 05/07/16 A BROWNING    Staphylococcus aureus NOT DETECTED NOT DETECTED Final   Methicillin resistance NOT DETECTED NOT DETECTED Final   Streptococcus species NOT DETECTED NOT DETECTED Final   Streptococcus agalactiae NOT DETECTED NOT DETECTED Final   Streptococcus pneumoniae NOT DETECTED NOT DETECTED Final   Streptococcus pyogenes NOT DETECTED NOT DETECTED Final   Acinetobacter baumannii NOT DETECTED NOT DETECTED Final   Enterobacteriaceae species NOT DETECTED NOT DETECTED Final   Enterobacter cloacae complex  NOT DETECTED NOT DETECTED Final   Escherichia coli NOT DETECTED NOT DETECTED Final   Klebsiella oxytoca NOT DETECTED NOT DETECTED Final   Klebsiella pneumoniae NOT DETECTED NOT DETECTED Final   Proteus species NOT DETECTED NOT DETECTED Final   Serratia marcescens NOT DETECTED NOT DETECTED Final   Haemophilus influenzae NOT DETECTED NOT DETECTED Final   Neisseria meningitidis NOT DETECTED NOT DETECTED Final   Pseudomonas aeruginosa NOT DETECTED NOT DETECTED Final   Candida albicans NOT DETECTED NOT DETECTED Final   Candida glabrata NOT DETECTED NOT DETECTED Final   Candida krusei NOT DETECTED NOT DETECTED Final   Candida parapsilosis NOT DETECTED NOT DETECTED Final   Candida tropicalis NOT DETECTED NOT DETECTED Final    Comment: Performed at [facility], [place] [address], [place], [place] [address]  Culture, blood (Routine X 2) w Reflex to ID Panel     Status: None (Preliminary result)   Collection Time: 05/05/16  9:02 PM  Result Value Ref Range Status   Specimen Description BLOOD RIGHT [facility]  Final   Special Requests BOTTLES DRAWN AEROBIC AND ANAEROBIC 5CC EACH  Final   Culture   Final    NO GROWTH 2 DAYS Performed at [facility], [place] [address] [place], [place] [address]    Report Status PENDING  Incomplete     Invalid input(s): PROCALCITONIN, LACTICACIDVEN   Radiology Studies: Mr Jeri Cos Wo Contrast  Result Date: 05/06/2016 CLINICAL DATA:  Confusion. Fever. Atrial fibrillation and accelerated hypertension. History of prostate and renal cancer. EXAM: MRI HEAD WITHOUT AND WITH CONTRAST TECHNIQUE: Multiplanar, multiecho pulse sequences of the brain and surrounding structures were obtained without and with intravenous contrast. CONTRAST:  106mL MULTIHANCE GADOBENATE DIMEGLUMINE 529 MG/ML IV SOLN COMPARISON:  Head CT 05/05/2016 FINDINGS: Multiple sequences are mildly to moderately motion degraded. Brain: There is no evidence of acute infarct, intracranial hemorrhage, midline  shift, or extra-axial fluid collection. There is mild cerebral atrophy. Confluent T2 hyperintensities in the deep cerebral white matter bilaterally are nonspecific but compatible with advanced chronic small vessel ischemic disease. There is an avidly enhancing mass inferior to the left frontal lobe along the floor of the anterior cranial fossa which appears extra-axial and dural based and  measures 16 x 15 x 13 mm (transverse x AP x craniocaudal). There is moderate vasogenic edema in the adjacent left frontal lobe. No other enhancing lesions are identified. Vascular: Major intracranial vascular flow voids are preserved. Skull and upper cervical spine: Unremarkable bone marrow signal. Sinuses/Orbits: Bilateral cataract extraction. Minimal scattered paranasal sinus mucosal thickening. Clear mastoid air cells. Other: None. IMPRESSION: 1. 16 mm mass along the floor of the left anterior cranial fossa most consistent with meningioma. Moderate left frontal lobe edema. 2. No acute infarct. 3. Advanced chronic small vessel ischemic disease. Electronically Signed   By: Logan Bores M.D.   On: 05/06/2016 13:55     Scheduled Meds: . amLODipine  5 mg Oral Daily  . apixaban  2.5 mg Oral BID  . cefTRIAXone (ROCEPHIN)  IV  1 g Intravenous Q24H  . dexamethasone  4 mg Oral Q6H  . insulin aspart  0-9 Units Subcutaneous TID WC  . labetalol  200 mg Oral BID  . sodium chloride flush  3 mL Intravenous Q12H   Continuous Infusions:    LOS: 1 day    Time spent: 35 minutes    Amari Burnsworth A, MD [facility] Pager [phone]  If 7PM-7AM, please contact night-coverage [email or web address] Password [email or web address] 05/08/2016, 11:17 AM

## 2016-05-08 NOTE — Progress Notes (Signed)
CSW spoke with patients son Merry Proud. Merry Proud is agreeable for patient to discharge to [facility] on 05/10/14. CSW receive  authorization from [facility] ([id number]) and [facility] is agreeable to take patient tomorrow. Medicare worker has contacted facility and given authorization number.   Rhea Pink, MSW,  [place]

## 2016-05-08 NOTE — Progress Notes (Signed)
Responded to spiritual care consult to assist patient with AD.  Ad was complete and copies given to patient and copy for chart and agent.    05/08/16 0940  Clinical Encounter Type  Visited With Patient and family together;Health care provider  Visit Type Initial;Spiritual support  Referral From Nurse [facility])  Spiritual Encounters  Spiritual Needs Literature;Emotional  Stress Factors  Patient Stress Factors None identified  Family Stress Factors None identified  Advance Directives (For Healthcare)  Does Patient Have a Medical Advance Directive? Yes  Type of Paramedic of [place];Living will  Copy of [place] in Chart? Yes  Copy of Living Will in Chart? Yes  Jaclynn Major, [place]

## 2016-05-08 NOTE — Progress Notes (Signed)
  Echocardiogram 2D Echocardiogram has been performed.  Tanner Manning 05/08/2016, 4:42 PM

## 2016-05-08 NOTE — Discharge Instructions (Signed)
Information on my medicine - ELIQUIS® (apixaban) ° °This medication education was reviewed with me or my healthcare representative as part of my discharge preparation.  The pharmacist that spoke with me during my hospital stay was:  Clint Biello Kay, RPH ° °Why was Eliquis® prescribed for you? °Eliquis® was prescribed for you to reduce the risk of forming blood clots that can cause a stroke if you have a medical condition called atrial fibrillation (a type of irregular heartbeat) OR to reduce the risk of a blood clots forming after orthopedic surgery. ° °What do You need to know about Eliquis® ? °Take your Eliquis® TWICE DAILY - one tablet in the morning and one tablet in the evening with or without food.  It would be best to take the doses about the same time each day. ° °If you have difficulty swallowing the tablet whole please discuss with your pharmacist how to take the medication safely. ° °Take Eliquis® exactly as prescribed by your doctor and DO NOT stop taking Eliquis® without talking to the doctor who prescribed the medication.  Stopping may increase your risk of developing a new clot or stroke.  Refill your prescription before you run out. ° °After discharge, you should have regular check-up appointments with your healthcare provider that is prescribing your Eliquis®.  In the future your dose may need to be changed if your kidney function or weight changes by a significant amount or as you get older. ° °What do you do if you miss a dose? °If you miss a dose, take it as soon as you remember on the same day and resume taking twice daily.  Do not take more than one dose of ELIQUIS at the same time. ° °Important Safety Information °A possible side effect of Eliquis® is bleeding. You should call your healthcare provider right away if you experience any of the following: °? Bleeding from an injury or your nose that does not stop. °? Unusual colored urine (red or dark brown) or unusual colored stools (red or  black). °? Unusual bruising for unknown reasons. °? A serious fall or if you hit your head (even if there is no bleeding). ° °Some medicines may interact with Eliquis® and might increase your risk of bleeding or clotting while on Eliquis®. To help avoid this, consult your healthcare provider or pharmacist prior to using any new prescription or non-prescription medications, including herbals, vitamins, non-steroidal anti-inflammatory drugs (NSAIDs) and supplements. ° °This website has more information on Eliquis® (apixaban): www.Eliquis.com. ° ° °

## 2016-05-09 ENCOUNTER — Other Ambulatory Visit (HOSPITAL_COMMUNITY): Payer: Medicare HMO

## 2016-05-09 LAB — BASIC METABOLIC PANEL
ANION GAP: 9 (ref 5–15)
BUN: 35 mg/dL — ABNORMAL HIGH (ref 6–20)
CHLORIDE: 100 mmol/L — AB (ref 101–111)
CO2: 24 mmol/L (ref 22–32)
Calcium: 9.1 mg/dL (ref 8.9–10.3)
Creatinine, Ser: 1.66 mg/dL — ABNORMAL HIGH (ref 0.61–1.24)
GFR calc Af Amer: 43 mL/min — ABNORMAL LOW (ref >60)
GFR calc non Af Amer: 37 mL/min — ABNORMAL LOW (ref >60)
GLUCOSE: 156 mg/dL — AB (ref 65–99)
POTASSIUM: 4.8 mmol/L (ref 3.5–5.1)
Sodium: 133 mmol/L — ABNORMAL LOW (ref 135–145)

## 2016-05-09 LAB — CBC
HEMATOCRIT: 40.6 % (ref 39.0–52.0)
HEMOGLOBIN: 13.2 g/dL (ref 13.0–17.0)
MCH: 29.3 pg (ref 26.0–34.0)
MCHC: 32.5 g/dL (ref 30.0–36.0)
MCV: 90 fL (ref 78.0–100.0)
Platelets: 227 10*3/uL (ref 150–400)
RBC: 4.51 MIL/uL (ref 4.22–5.81)
RDW: 14.1 % (ref 11.5–15.5)
WBC: 10.2 10*3/uL (ref 4.0–10.5)

## 2016-05-09 LAB — CULTURE, BLOOD (ROUTINE X 2)

## 2016-05-09 LAB — GLUCOSE, CAPILLARY: Glucose-Capillary: 146 mg/dL — ABNORMAL HIGH (ref 65–99)

## 2016-05-09 MED ORDER — APIXABAN 2.5 MG PO TABS: 2.5000 mg | ORAL_TABLET | Freq: Two times a day (BID) | ORAL | 0 refills | Status: AC

## 2016-05-09 MED ORDER — MULTI-VITAMIN/MINERALS PO TABS: 1.0000 | ORAL_TABLET | Freq: Every day | ORAL | Status: AC

## 2016-05-09 MED ORDER — AMLODIPINE BESYLATE 10 MG PO TABS: 10.0000 mg | ORAL_TABLET | Freq: Every day | ORAL | 0 refills | Status: AC

## 2016-05-09 NOTE — Progress Notes (Signed)
05/09/2016 12:21 PM Discharge AVS meds taken today and those due this evening reviewed.  Follow-up appointments and when to call md reviewed.  D/C IV and TELE.  Questions and concerns addressed.   D/C with son Merry Proud.  Merry Proud will be taking pt to [facility].  Will call report to facility. [place]

## 2016-05-09 NOTE — Discharge Summary (Signed)
Physician Discharge Summary  Tristin Vandeusen HAL:[id number] DOB: 1933-10-30 DOA: 05/05/2016  PCP: Manfred Shirts, PA  Admit date: 05/05/2016 Discharge date: 05/09/2016  Admitted From: Home Disposition: Home  Recommendations for Outpatient Follow-up:  1. Follow up with PCP in 1-2 weeks 2. Please obtain BMP/CBC in one week  Home Health: NA Equipment/Devices:NA  Discharge Condition: Stable CODE STATUS: Full Code Diet recommendation: Diet Heart Room service appropriate? Yes; Fluid consistency: Thin Diet - low sodium heart healthy  Brief/Interim Summary: Kennieth Bowersis a 83 y.o.malewith medical history significant of noncompliance has not seen a doctor for years was found at home by his family confused. Unknown what pts baseline is. Was found to be running a fever. He says he has been weak for several days. He is hard of hearing and does seem to be a little confused but for the most part he knows he is in the hospital and it is 2018. He denies any n/v/d. No dysuria. No cough. No rashes. Pt sent over from [facility] for fever.    Discharge Diagnoses:  Principal Problem:   Acute encephalopathy Active Problems:   Diabetes mellitus without complication (HCC)   Cancer (HCC)   Fever   Acute lower UTI   A-fib (HCC)   Creatinine elevation    Fever Unclear etiology, his CXR is negative, influenza swab is negative, his maximum temperature was 100.5. Been treated for presumed UTI, his UA showed some bacteria, Completed 3 days of antibiotics. Blood cultures showed 1/2 coagulase negative staph, this is likely contaminant, no indication for treatment. At this is resolved.  Atrial fibrillation, new onset Not documented before, presumed to be new, currently rate is controlled with beta blockers. CHA2DS2-VASc is at least 4, HTN, DM, and 2+ for his age, no history of falls, he will need anticoagulation. 2-D echo showed normal LVEF of 65-70% with moderate concentric hypertrophy. Discharge  on Eliquis (renally adjusted at 2.5 mg twice a day) and labetalol twice a day.   Accelerated hypertension Blood pressure is 203/127 this morning, giving hydralazine, restarted on his labetalol. CT and MRI of the head showed no acute findings. Blood pressure more controlled, on labetalol and added Norvasc, continued to monitor, had medications as needed.  Meningioma -MRI of the brain showed 16 mm meningioma with moderate vasogenic edema. This is discussed with his son. -No midline shift, this is does not explain his symptoms when he came in. -Treated with short course of Decadron, was not prescribed on discharge.  Diabetes mellitus Hemoglobin A1c is 5.6, Carbohydrate modified diet and insulin sliding scale. Continue glimepiride.  Confusion -Preadmission he was hard of hearing but somewhat confused, he knows where he had cannot remember a lot. -MRI of the brain without acute findings. -Part of his confusion that he is really hard of hearing and cannot register what most of people say.   HOH -Per his son this is stable condition. Hears better in the left side  CKD stage III -Likely has CKD stage III his creatinine around 1.6, likely secondary to HTN and DM.   Discharge Instructions  Discharge Instructions    Diet - low sodium heart healthy    Complete by:  As directed    Increase activity slowly    Complete by:  As directed      Allergies as of 05/09/2016   No Known Allergies     Medication List    TAKE these medications   amLODipine 10 MG tablet Commonly known as:  NORVASC Take 1 tablet (10  mg total) by mouth daily. Start taking on:  05/10/2016   apixaban 2.5 MG Tabs tablet Commonly known as:  ELIQUIS Take 1 tablet (2.5 mg total) by mouth 2 (two) times daily.   aspirin EC 81 MG tablet Take 81 mg by mouth daily.   citalopram 40 MG tablet Commonly known as:  CELEXA Take 40 mg by mouth daily.   glimepiride 2 MG tablet Commonly known as:  AMARYL Take 4 mg by  mouth daily before breakfast.   labetalol 200 MG tablet Commonly known as:  NORMODYNE Take 200 mg by mouth 2 (two) times daily.   levothyroxine 175 MCG tablet Commonly known as:  SYNTHROID, LEVOTHROID Take 175 mcg by mouth daily before breakfast.   multivitamin with minerals tablet Take 1 tablet by mouth daily.   simvastatin 40 MG tablet Commonly known as:  ZOCOR Take 40 mg by mouth daily at 6 PM.   Triptorelin Pamoate 22.5 MG injection Commonly known as:  TRELSTAR Inject 22.5 mg into the muscle every 6 (six) months.      Contact information for after-discharge care    Destination    [facility] .   Specialty:  Skilled Nursing Facility Contact information: [address] [place] [phone]             No Known Allergies  Consultations: none   Procedures 2-D echo done on 05/08/2016 Study Conclusions  - Left ventricle: The cavity size was normal. There was moderate   concentric hypertrophy. Systolic function was vigorous. The   estimated ejection fraction was in the range of 65% to 70%. Wall   motion was normal; there were no regional wall motion   abnormalities. The study was not technically sufficient to allow   evaluation of LV diastolic dysfunction due to atrial   fibrillation. - Aortic valve: Trileaflet; normal thickness leaflets. There was no   regurgitation. - Aortic root: The aortic root was normal in size. - Mitral valve: There was no regurgitation. - Left atrium: The atrium was moderately dilated. - Right ventricle: Systolic function was normal. - Tricuspid valve: There was mild regurgitation. - Pulmonary arteries: Systolic pressure was within the normal   range. - Inferior vena cava: The vessel was normal in size.  Radiological studies: Dg Chest 2 View  Result Date: 05/05/2016 CLINICAL DATA:  Failure to thrive. Incontinence. Memory loss. Chest pain. EXAM: CHEST  2 VIEW COMPARISON:  None. FINDINGS:  Both lateral views are degraded by patient positioning, including the arms not being raised above the head. Moderate thoracic spondylosis. Mild to moderate right hemidiaphragm elevation. Apical lordotic frontal radiograph. Midline trachea. Patient rotated minimally right on the frontal. Cardiomegaly. Mildly tortuous thoracic aorta. No pleural effusion or pneumothorax. No congestive failure. IMPRESSION: No acute cardiopulmonary disease. Cardiomegaly without congestive failure. Electronically Signed   By: Abigail Miyamoto M.D.   On: 05/05/2016 20:15   Ct Head Wo Contrast  Result Date: 05/05/2016 CLINICAL DATA:  Status post fall, with concern for head injury. Initial encounter. EXAM: CT HEAD WITHOUT CONTRAST TECHNIQUE: Contiguous axial images were obtained from the base of the skull through the vertex without intravenous contrast. COMPARISON:  None. FINDINGS: Brain: No evidence of acute infarction, hemorrhage, hydrocephalus, extra-axial collection or mass lesion/mass effect. Prominence of the ventricles and sulci reflects mild to moderate cortical volume loss. Diffuse periventricular and subcortical white matter change likely reflects small vessel ischemic microangiopathy. The brainstem and fourth ventricle are within normal limits. The basal ganglia are unremarkable  in appearance. The cerebral hemispheres demonstrate grossly normal gray-white differentiation. No mass effect or midline shift is seen. Vascular: No hyperdense vessel or unexpected calcification. Skull: There is no evidence of fracture; visualized osseous structures are unremarkable in appearance. Sinuses/Orbits: The orbits are within normal limits. The paranasal sinuses and mastoid air cells are well-aerated. Other: No significant soft tissue abnormalities are seen. IMPRESSION: 1. No evidence of traumatic intracranial injury or fracture. 2. Mild to moderate cortical volume loss and diffuse small vessel ischemic microangiopathy. Electronically Signed   By:  Garald Balding M.D.   On: 05/05/2016 23:53   Mr Jeri Cos BD Contrast  Result Date: 05/06/2016 CLINICAL DATA:  Confusion. Fever. Atrial fibrillation and accelerated hypertension. History of prostate and renal cancer. EXAM: MRI HEAD WITHOUT AND WITH CONTRAST TECHNIQUE: Multiplanar, multiecho pulse sequences of the brain and surrounding structures were obtained without and with intravenous contrast. CONTRAST:  57mL MULTIHANCE GADOBENATE DIMEGLUMINE 529 MG/ML IV SOLN COMPARISON:  Head CT 05/05/2016 FINDINGS: Multiple sequences are mildly to moderately motion degraded. Brain: There is no evidence of acute infarct, intracranial hemorrhage, midline shift, or extra-axial fluid collection. There is mild cerebral atrophy. Confluent T2 hyperintensities in the deep cerebral white matter bilaterally are nonspecific but compatible with advanced chronic small vessel ischemic disease. There is an avidly enhancing mass inferior to the left frontal lobe along the floor of the anterior cranial fossa which appears extra-axial and dural based and measures 16 x 15 x 13 mm (transverse x AP x craniocaudal). There is moderate vasogenic edema in the adjacent left frontal lobe. No other enhancing lesions are identified. Vascular: Major intracranial vascular flow voids are preserved. Skull and upper cervical spine: Unremarkable bone marrow signal. Sinuses/Orbits: Bilateral cataract extraction. Minimal scattered paranasal sinus mucosal thickening. Clear mastoid air cells. Other: None. IMPRESSION: 1. 16 mm mass along the floor of the left anterior cranial fossa most consistent with meningioma. Moderate left frontal lobe edema. 2. No acute infarct. 3. Advanced chronic small vessel ischemic disease. Electronically Signed   By: Logan Bores M.D.   On: 05/06/2016 13:55     Subjective:  Discharge Exam: Vitals:   05/08/16 2105 05/09/16 0459 05/09/16 0956 05/09/16 0957  BP: (!) 153/81 (!) 169/106 (!) 159/92   Pulse: 97 99  (!) 101  Resp:  20 18    Temp: 98.4 F (36.9 C) 98.2 F (36.8 C)    TempSrc: Oral Oral    SpO2: 94% 94%    Weight:      Height:       General: Pt is alert, awake, not in acute distress Cardiovascular: RRR, S1/S2 +, no rubs, no gallops Respiratory: CTA bilaterally, no wheezing, no rhonchi Abdominal: Soft, NT, ND, bowel sounds + Extremities: no edema, no cyanosis   The results of significant diagnostics from this hospitalization (including imaging, microbiology, ancillary and laboratory) are listed below for reference.    Microbiology: Recent Results (from the past 240 hour(s))  Culture, blood (Routine X 2) w Reflex to ID Panel     Status: Abnormal   Collection Time: 05/05/16  7:45 PM  Result Value Ref Range Status   Specimen Description BLOOD LEFT AC  Final   Special Requests BOTTLES DRAWN AEROBIC AND ANAEROBIC 5CC EACH  Final   Culture  Setup Time   Final    GRAM POSITIVE COCCI IN CLUSTERS AEROBIC BOTTLE ONLY CRITICAL RESULT CALLED TO, READ BACK BY AND VERIFIED WITHTillman Sers [facility] 2133 05/07/16 A BROWNING    Culture (A)  Final    STAPHYLOCOCCUS SPECIES (COAGULASE NEGATIVE) THE SIGNIFICANCE OF ISOLATING THIS ORGANISM FROM A SINGLE SET OF BLOOD CULTURES WHEN MULTIPLE SETS ARE DRAWN IS UNCERTAIN. PLEASE NOTIFY THE MICROBIOLOGY DEPARTMENT WITHIN ONE WEEK IF SPECIATION AND SENSITIVITIES ARE REQUIRED. Performed at [facility], [place] [address] [place], [place] [address]    Report Status 05/09/2016 FINAL  Final  Blood Culture ID Panel (Reflexed)     Status: Abnormal   Collection Time: 05/05/16  7:45 PM  Result Value Ref Range Status   Enterococcus species NOT DETECTED NOT DETECTED Final   Listeria monocytogenes NOT DETECTED NOT DETECTED Final   Staphylococcus species DETECTED (A) NOT DETECTED Final    Comment: Methicillin (oxacillin) susceptible coagulase negative staphylococcus. Possible blood culture contaminant (unless isolated from more than one blood culture draw or clinical case  suggests pathogenicity). No antibiotic treatment is indicated for blood  culture contaminants. CRITICAL RESULT CALLED TO, READ BACK BY AND VERIFIED WITH: Tillman Sers [facility] 2133 05/07/16 A BROWNING    Staphylococcus aureus NOT DETECTED NOT DETECTED Final   Methicillin resistance NOT DETECTED NOT DETECTED Final   Streptococcus species NOT DETECTED NOT DETECTED Final   Streptococcus agalactiae NOT DETECTED NOT DETECTED Final   Streptococcus pneumoniae NOT DETECTED NOT DETECTED Final   Streptococcus pyogenes NOT DETECTED NOT DETECTED Final   Acinetobacter baumannii NOT DETECTED NOT DETECTED Final   Enterobacteriaceae species NOT DETECTED NOT DETECTED Final   Enterobacter cloacae complex NOT DETECTED NOT DETECTED Final   Escherichia coli NOT DETECTED NOT DETECTED Final   Klebsiella oxytoca NOT DETECTED NOT DETECTED Final   Klebsiella pneumoniae NOT DETECTED NOT DETECTED Final   Proteus species NOT DETECTED NOT DETECTED Final   Serratia marcescens NOT DETECTED NOT DETECTED Final   Haemophilus influenzae NOT DETECTED NOT DETECTED Final   Neisseria meningitidis NOT DETECTED NOT DETECTED Final   Pseudomonas aeruginosa NOT DETECTED NOT DETECTED Final   Candida albicans NOT DETECTED NOT DETECTED Final   Candida glabrata NOT DETECTED NOT DETECTED Final   Candida krusei NOT DETECTED NOT DETECTED Final   Candida parapsilosis NOT DETECTED NOT DETECTED Final   Candida tropicalis NOT DETECTED NOT DETECTED Final    Comment: Performed at [facility], [place] [address] [place], [place] [address]  Culture, blood (Routine X 2) w Reflex to ID Panel     Status: None (Preliminary result)   Collection Time: 05/05/16  9:02 PM  Result Value Ref Range Status   Specimen Description BLOOD RIGHT [facility]  Final   Special Requests BOTTLES DRAWN AEROBIC AND ANAEROBIC 5CC EACH  Final   Culture   Final    NO GROWTH 3 DAYS Performed at [facility], [place] [address], [place], [place] [address]    Report Status  PENDING  Incomplete     Labs: BNP (last 3 results) No results for input(s): BNP in the last 8760 hours. Basic Metabolic Panel:  Recent Labs Lab 05/05/16 1850 05/06/16 0022 05/07/16 0303 05/09/16 0221  NA 135 135 136 133*  K 4.4 4.3 4.5 4.8  CL 101 100* 100* 100*  CO2 23 26 27 24   GLUCOSE 164* 123* 111* 156*  BUN 26* 23* 22* 35*  CREATININE 1.78* 1.73* 1.66* 1.66*  CALCIUM 9.1 9.1 9.0 9.1   Liver Function Tests:  Recent Labs Lab 05/05/16 1850  AST 29  ALT 20  ALKPHOS 59  BILITOT 0.9  PROT 7.7  ALBUMIN 4.1    Recent Labs Lab 05/05/16 1850  LIPASE 13    Recent Labs Lab 05/05/16 1957  AMMONIA 38*   CBC:  Recent Labs Lab 05/05/16 1850 05/06/16 0022 05/07/16 0703 05/09/16 0221  WBC 11.5* 8.9 8.2 10.2  NEUTROABS 10.1*  --   --   --   HGB 14.8 13.8 14.2 13.2  HCT 45.1 43.5 44.1 40.6  MCV 90.4 91.0 90.6 90.0  PLT 204 173 168 227   Cardiac Enzymes:  Recent Labs Lab 05/05/16 1850  CKTOTAL 299  TROPONINI 0.03*   BNP: Invalid input(s): POCBNP CBG:  Recent Labs Lab 05/07/16 2155 05/08/16 0650 05/08/16 1054 05/08/16 2103 05/09/16 0618  GLUCAP 166* 136* 143* 197* 146*   D-Dimer No results for input(s): DDIMER in the last 72 hours. Hgb A1c No results for input(s): HGBA1C in the last 72 hours. Lipid Profile No results for input(s): CHOL, HDL, LDLCALC, TRIG, CHOLHDL, LDLDIRECT in the last 72 hours. Thyroid function studies No results for input(s): TSH, T4TOTAL, T3FREE, THYROIDAB in the last 72 hours.  Invalid input(s): FREET3 Anemia work up No results for input(s): VITAMINB12, FOLATE, FERRITIN, TIBC, IRON, RETICCTPCT in the last 72 hours. Urinalysis    Component Value Date/Time   COLORURINE YELLOW 05/05/2016 1849   APPEARANCEUR CLOUDY (A) 05/05/2016 1849   LABSPEC 1.018 05/05/2016 1849   PHURINE 5.5 05/05/2016 1849   GLUCOSEU NEGATIVE 05/05/2016 1849   HGBUR MODERATE (A) 05/05/2016 1849   BILIRUBINUR NEGATIVE 05/05/2016 1849    KETONESUR NEGATIVE 05/05/2016 1849   PROTEINUR 100 (A) 05/05/2016 1849   NITRITE NEGATIVE 05/05/2016 1849   LEUKOCYTESUR TRACE (A) 05/05/2016 1849   Sepsis Labs Invalid input(s): PROCALCITONIN,  WBC,  LACTICIDVEN Microbiology Recent Results (from the past 240 hour(s))  Culture, blood (Routine X 2) w Reflex to ID Panel     Status: Abnormal   Collection Time: 05/05/16  7:45 PM  Result Value Ref Range Status   Specimen Description BLOOD LEFT AC  Final   Special Requests BOTTLES DRAWN AEROBIC AND ANAEROBIC 5CC EACH  Final   Culture  Setup Time   Final    GRAM POSITIVE COCCI IN CLUSTERS AEROBIC BOTTLE ONLY CRITICAL RESULT CALLED TO, READ BACK BY AND VERIFIED WITH: Tillman Sers [facility] 2133 05/07/16 A BROWNING    Culture (A)  Final    STAPHYLOCOCCUS SPECIES (COAGULASE NEGATIVE) THE SIGNIFICANCE OF ISOLATING THIS ORGANISM FROM A SINGLE SET OF BLOOD CULTURES WHEN MULTIPLE SETS ARE DRAWN IS UNCERTAIN. PLEASE NOTIFY THE MICROBIOLOGY DEPARTMENT WITHIN ONE WEEK IF SPECIATION AND SENSITIVITIES ARE REQUIRED. Performed at [facility], [place] [address], [place],  [address]    Report Status 05/09/2016 FINAL  Final  Blood Culture ID Panel (Reflexed)     Status: Abnormal   Collection Time: 05/05/16  7:45 PM  Result Value Ref Range Status   Enterococcus species NOT DETECTED NOT DETECTED Final   Listeria monocytogenes NOT DETECTED NOT DETECTED Final   Staphylococcus species DETECTED (A) NOT DETECTED Final    Comment: Methicillin (oxacillin) susceptible coagulase negative staphylococcus. Possible blood culture contaminant (unless isolated from more than one blood culture draw or clinical case suggests pathogenicity). No antibiotic treatment is indicated for blood  culture contaminants. CRITICAL RESULT CALLED TO, READ BACK BY AND VERIFIED WITH: Tillman Sers [facility] 2133 05/07/16 A BROWNING    Staphylococcus aureus NOT DETECTED NOT DETECTED Final   Methicillin resistance NOT DETECTED NOT DETECTED Final    Streptococcus species NOT DETECTED NOT DETECTED Final   Streptococcus agalactiae NOT DETECTED NOT DETECTED Final   Streptococcus  pneumoniae NOT DETECTED NOT DETECTED Final   Streptococcus pyogenes NOT DETECTED NOT DETECTED Final   Acinetobacter baumannii NOT DETECTED NOT DETECTED Final   Enterobacteriaceae species NOT DETECTED NOT DETECTED Final   Enterobacter cloacae complex NOT DETECTED NOT DETECTED Final   Escherichia coli NOT DETECTED NOT DETECTED Final   Klebsiella oxytoca NOT DETECTED NOT DETECTED Final   Klebsiella pneumoniae NOT DETECTED NOT DETECTED Final   Proteus species NOT DETECTED NOT DETECTED Final   Serratia marcescens NOT DETECTED NOT DETECTED Final   Haemophilus influenzae NOT DETECTED NOT DETECTED Final   Neisseria meningitidis NOT DETECTED NOT DETECTED Final   Pseudomonas aeruginosa NOT DETECTED NOT DETECTED Final   Candida albicans NOT DETECTED NOT DETECTED Final   Candida glabrata NOT DETECTED NOT DETECTED Final   Candida krusei NOT DETECTED NOT DETECTED Final   Candida parapsilosis NOT DETECTED NOT DETECTED Final   Candida tropicalis NOT DETECTED NOT DETECTED Final    Comment: Performed at [facility], [place] [address] [place], [place] [address]  Culture, blood (Routine X 2) w Reflex to ID Panel     Status: None (Preliminary result)   Collection Time: 05/05/16  9:02 PM  Result Value Ref Range Status   Specimen Description BLOOD RIGHT [facility]  Final   Special Requests BOTTLES DRAWN AEROBIC AND ANAEROBIC 5CC EACH  Final   Culture   Final    NO GROWTH 3 DAYS Performed at [facility], [place] [address], [place], [place] [address]    Report Status PENDING  Incomplete     Time coordinating discharge: Over 30 minutes  SIGNED:   Birdie Hopes, MD  [facility] 05/09/2016, 10:26 AM Pager   If 7PM-7AM, please contact night-coverage [email or web address] Password [email or web address]

## 2016-05-09 NOTE — Progress Notes (Signed)
Clinical Social Worker facilitated patient discharge including contacting patient family and facility to confirm patient discharge plans.  Clinical information faxed to facility and family agreeable with plan. Patients son Merry Proud stated he will be transporting patient to facility. Merry Proud stated he wants to stop by patients house so patient can say bye to his animals before the dog is put dowm.CSW encouraged Merry Proud to take patient from Hospital and straight to the facility without any stop. RN Juliann Pulse to call [phone] (click option 4 and then ask for RN [facility]) for report prior to discharge.  Clinical Social Worker will sign off for now as social work intervention is no longer needed. Please consult [place] again if new need arises.  Rhea Pink, MSW, [place]

## 2016-05-10 LAB — CULTURE, BLOOD (ROUTINE X 2): Culture: NO GROWTH

## 2017-12-28 IMAGING — MR MR HEAD WO/W CM
11 of 13 series · 33 of 48 positions shown · IV contrast (multihance)
Comparison: Head CT 05/05/2016

CLINICAL DATA: Confusion. Fever. Atrial fibrillation and
accelerated hypertension. History of prostate and renal cancer.

EXAM:
MRI HEAD WITHOUT AND WITH CONTRAST
TECHNIQUE: Multiplanar, multiecho pulse sequences of the brain and surrounding
structures were obtained without and with intravenous contrast.
CONTRAST:  20mL MULTIHANCE GADOBENATE DIMEGLUMINE 529 MG/ML IV SOLN

[Series 3: DWI · axial · 3.0mm · 0.94mm/px · z∈[-20,+126]mm · 8 of 100 slices shown (1 of 2)]
[im 1/100]
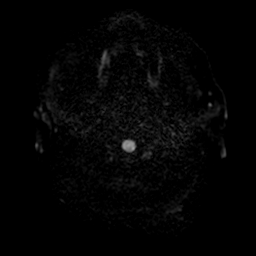
[im 15/100]
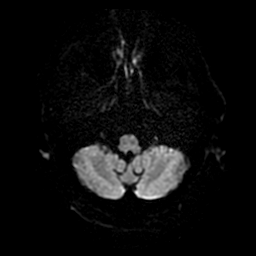
[im 29/100]
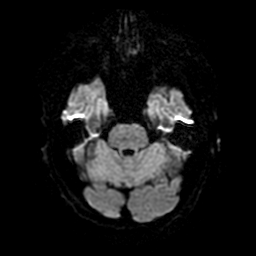
[im 43/100]
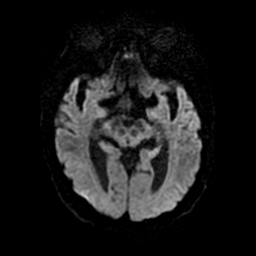
[im 57/100]
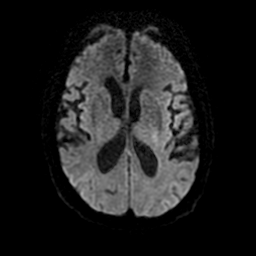
[im 71/100]
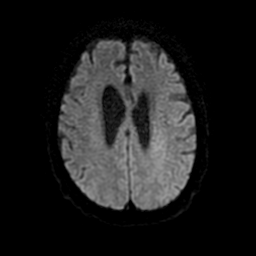
[im 85/100]
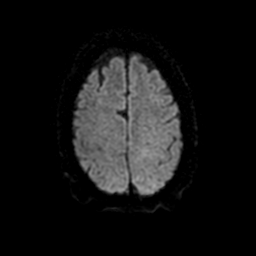
[im 100/100]
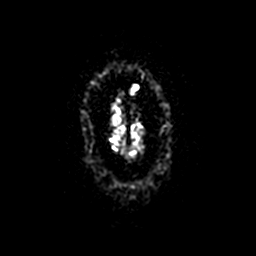

[Series 4: T2 · axial · 5.0mm · 0.47mm/px · z∈[-19,+124]mm · 2 of 25 slices shown (1 of 2)]
[im 1/25]
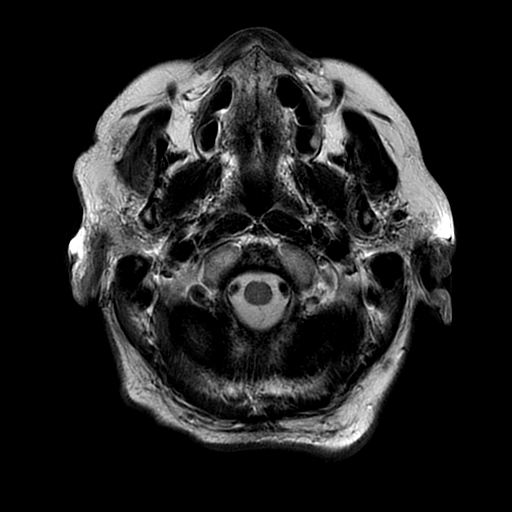
[im 25/25]
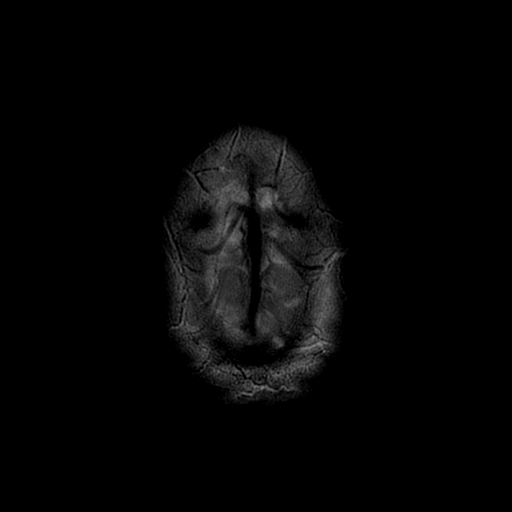

[Series 5: FLAIR · axial · 5.0mm · 0.47mm/px · z∈[-19,+124]mm · 2 of 25 slices shown (1 of 2)]
[im 1/25]
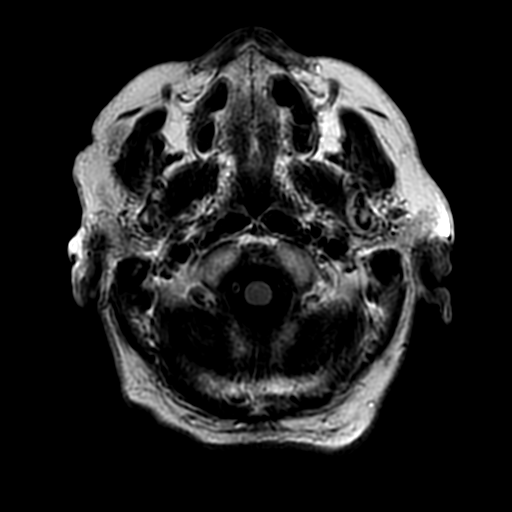
[im 25/25]
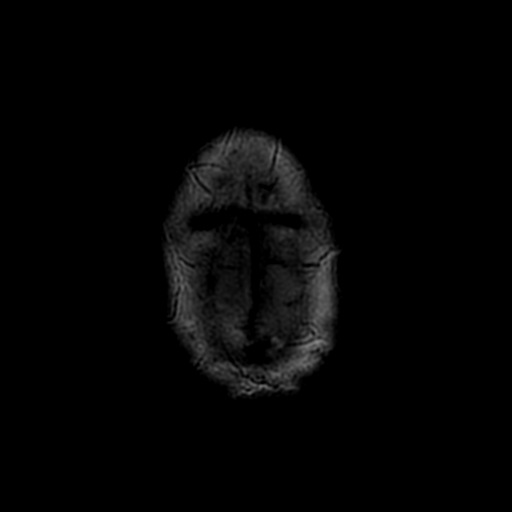

[Series 6: FLAIR · sagittal · 5.0mm · 0.47mm/px · 2 of 23 slices shown (2 of 2)]
[im 1/23]
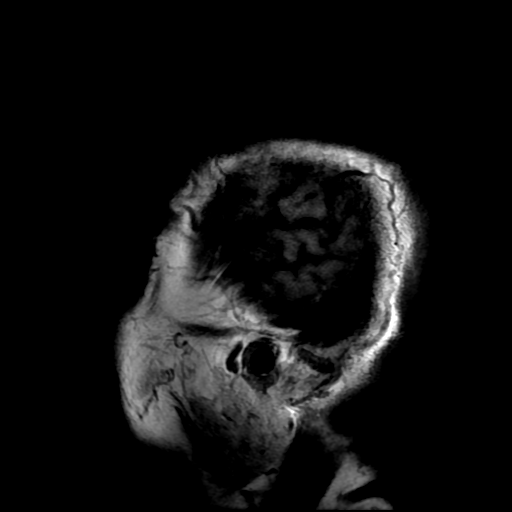
[im 23/23]
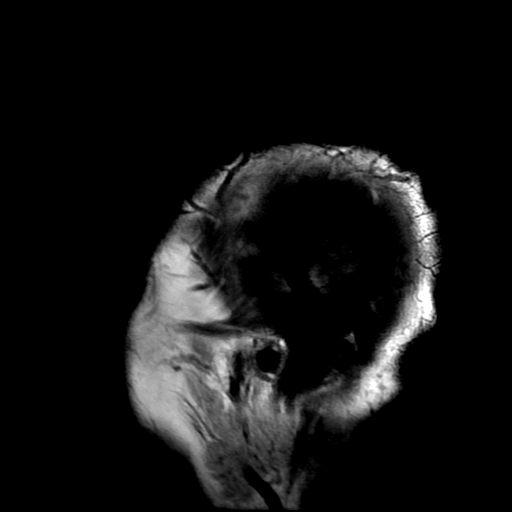

[Series 7: DWI · coronal · 4.0mm · 0.94mm/px · 5 of 68 slices shown (2 of 2)]
[im 1/68]
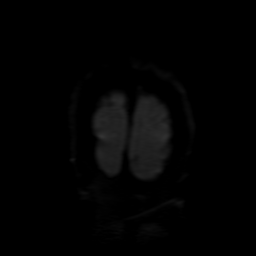
[im 17/68]
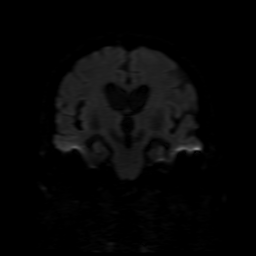
[im 34/68]
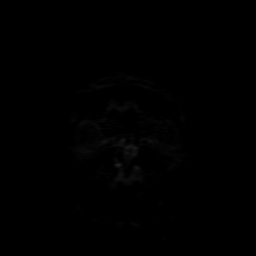
[im 51/68]
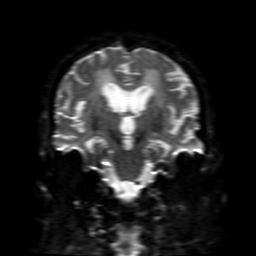
[im 68/68]
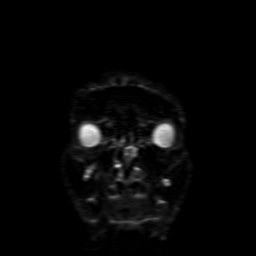

[Series 8: (person_name) · axial · 3.0mm · 0.47mm/px · 1 of 100 slices shown]
[im 1/100]
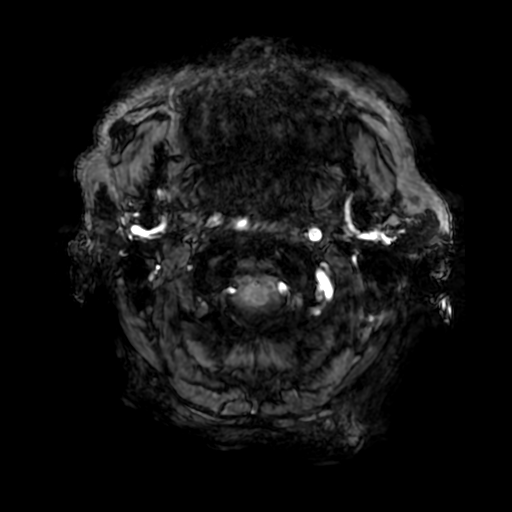

[Series 10: T2 · coronal · 5.0mm · 0.39mm/px · 2 of 28 slices shown (2 of 2)]
[im 1/28]
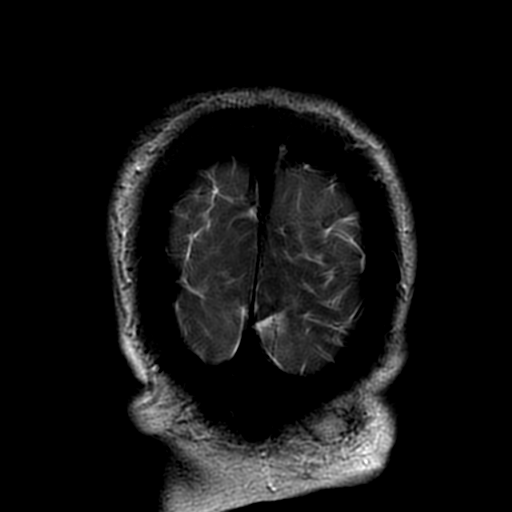
[im 28/28]
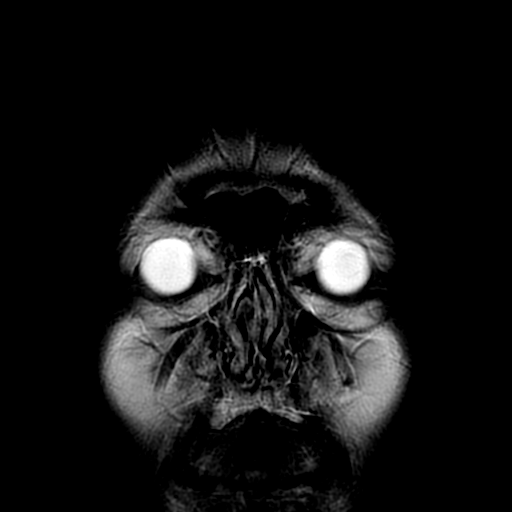

[Series 13: T1 · coronal · 5.0mm · 0.39mm/px · 2 of 28 slices shown]
[im 1/28]
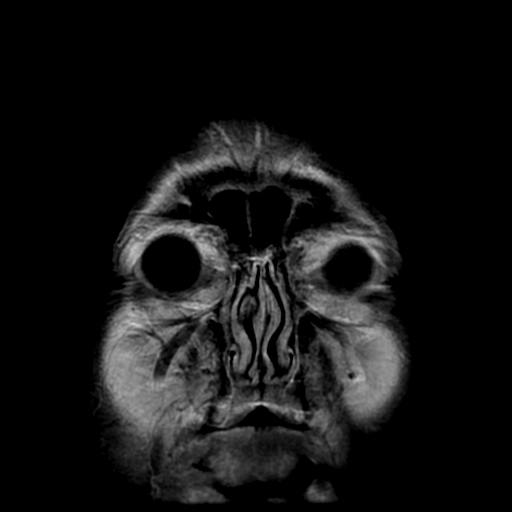
[im 28/28]
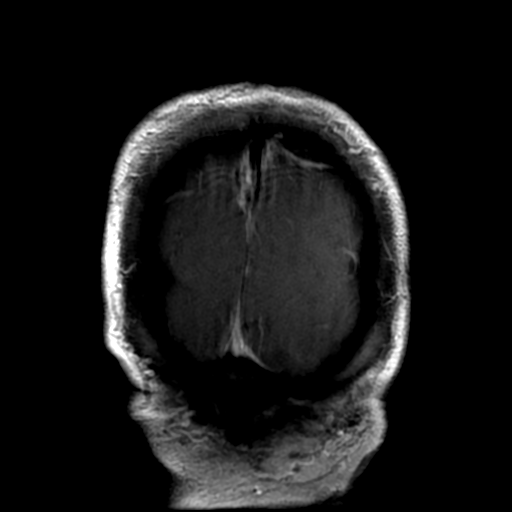

[Series 14: FLAIR post-contrast · sagittal · 5.0mm · 0.47mm/px · 2 of 23 slices shown]
[im 1/23]
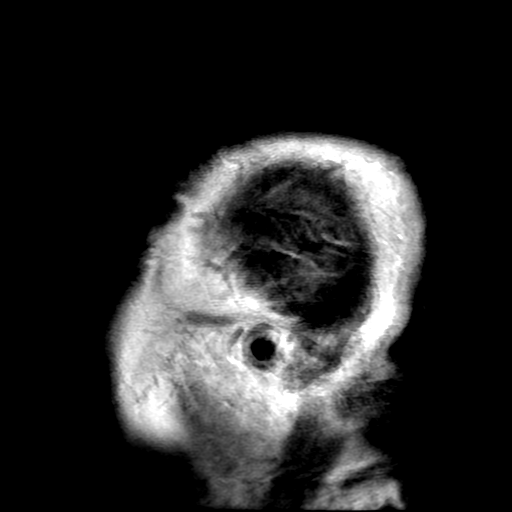
[im 23/23]
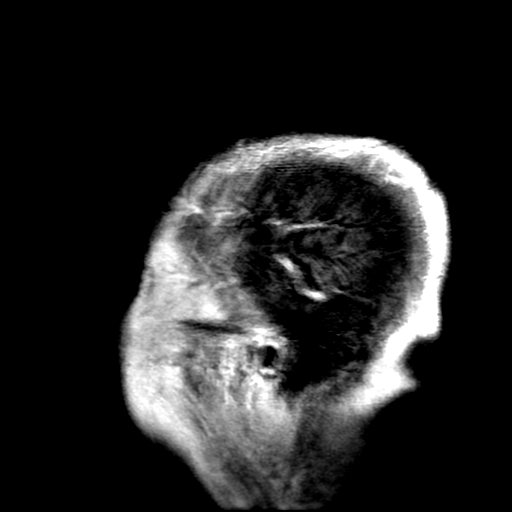

[Series 350: ADC · axial · 3.0mm · 0.94mm/px · z∈[-20,+126]mm · 4 of 50 slices shown (1 of 2)]
[im 1/50]
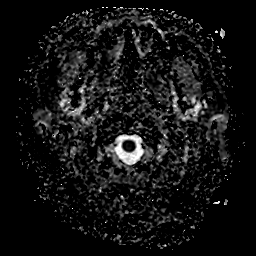
[im 17/50]
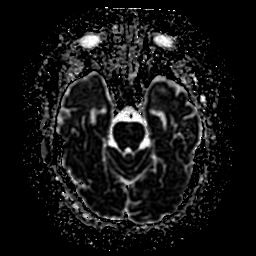
[im 33/50]
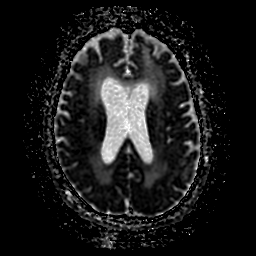
[im 50/50]
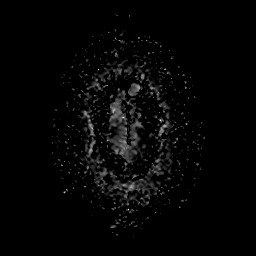

[Series 750: ADC · coronal · 4.0mm · 0.94mm/px · 3 of 34 slices shown (2 of 2)]
[im 1/34]
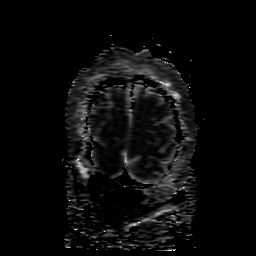
[im 17/34]
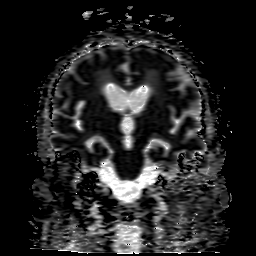
[im 34/34]
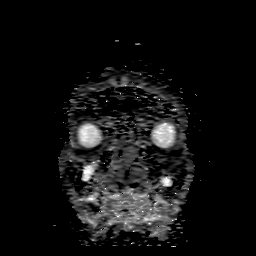

[33 of 48 positions shown; findings below may reference images not displayed]

FINDINGS: Multiple sequences are mildly to moderately motion degraded.

Brain: There is no evidence of acute infarct, intracranial
hemorrhage, midline shift, or extra-axial fluid collection. There is
mild cerebral atrophy. Confluent T2 hyperintensities in the deep
cerebral white matter bilaterally are nonspecific but compatible
with advanced chronic small vessel ischemic disease.

There is an avidly enhancing mass inferior to the left frontal lobe
along the floor of the anterior cranial fossa which appears
extra-axial and dural based and measures 16 x 15 x 13 mm (transverse
x AP x craniocaudal). There is moderate vasogenic edema in the
adjacent left frontal lobe. No other enhancing lesions are
identified.

Vascular: Major intracranial vascular flow voids are preserved.

Skull and upper cervical spine: Unremarkable bone marrow signal.

Sinuses/Orbits: Bilateral cataract extraction. Minimal scattered
paranasal sinus mucosal thickening. Clear mastoid air cells.

Other: None.
IMPRESSION: 1. 16 mm mass along the floor of the left anterior cranial fossa
most consistent with meningioma. Moderate left frontal lobe edema.
2. No acute infarct.
3. Advanced chronic small vessel ischemic disease.

## 2018-01-09 DEATH — deceased

## 2018-02-09 DEATH — deceased
